# Patient Record
Sex: Male | Born: 1950 | Race: White | Hispanic: No | Marital: Single | Smoking: Current every day smoker
Health system: Southern US, Community
[De-identification: ages and names within clinical notes are randomized; demographics above are authoritative.]

## PROBLEM LIST (undated history)

## (undated) DIAGNOSIS — F1010 Alcohol abuse, uncomplicated: Secondary | ICD-10-CM

## (undated) DIAGNOSIS — I219 Acute myocardial infarction, unspecified: Secondary | ICD-10-CM

## (undated) DIAGNOSIS — I2510 Atherosclerotic heart disease of native coronary artery without angina pectoris: Secondary | ICD-10-CM

## (undated) DIAGNOSIS — F1020 Alcohol dependence, uncomplicated: Secondary | ICD-10-CM

## (undated) DIAGNOSIS — I10 Essential (primary) hypertension: Secondary | ICD-10-CM

## (undated) DIAGNOSIS — J449 Chronic obstructive pulmonary disease, unspecified: Secondary | ICD-10-CM

## (undated) DIAGNOSIS — F319 Bipolar disorder, unspecified: Secondary | ICD-10-CM

## (undated) DIAGNOSIS — I639 Cerebral infarction, unspecified: Secondary | ICD-10-CM

## (undated) HISTORY — PX: CHOLECYSTECTOMY: SHX55

## (undated) HISTORY — PX: CORONARY ARTERY BYPASS GRAFT: SHX141

## (undated) HISTORY — PX: CORONARY STENT PLACEMENT: SHX1402

## (undated) HISTORY — PX: APPENDECTOMY: SHX54

## (undated) SURGERY — LEFT HEART CATH AND CORONARY ANGIOGRAPHY
Anesthesia: Moderate Sedation | Laterality: Left

---

## 2009-07-20 ENCOUNTER — Inpatient Hospital Stay: Payer: Self-pay | Admitting: Unknown Physician Specialty

## 2009-07-21 ENCOUNTER — Ambulatory Visit: Payer: Self-pay | Admitting: Cardiology

## 2010-07-05 ENCOUNTER — Inpatient Hospital Stay: Payer: Self-pay | Admitting: Surgery

## 2011-03-14 ENCOUNTER — Inpatient Hospital Stay: Payer: Self-pay | Admitting: Internal Medicine

## 2011-03-15 ENCOUNTER — Encounter: Payer: Self-pay | Admitting: Cardiovascular Disease

## 2011-03-15 DIAGNOSIS — R079 Chest pain, unspecified: Secondary | ICD-10-CM

## 2011-03-15 DIAGNOSIS — I2510 Atherosclerotic heart disease of native coronary artery without angina pectoris: Secondary | ICD-10-CM

## 2011-07-16 ENCOUNTER — Emergency Department: Payer: Self-pay | Admitting: Unknown Physician Specialty

## 2011-11-10 ENCOUNTER — Emergency Department: Payer: Self-pay | Admitting: Emergency Medicine

## 2011-11-10 LAB — CBC
HCT: 39.5 % — ABNORMAL LOW (ref 40.0–52.0)
HGB: 13.2 g/dL (ref 13.0–18.0)
MCH: 30 pg (ref 26.0–34.0)
MCHC: 33.5 g/dL (ref 32.0–36.0)
MCV: 89 fL (ref 80–100)
Platelet: 212 10*3/uL (ref 150–440)
RDW: 13.2 % (ref 11.5–14.5)
WBC: 10.9 10*3/uL — ABNORMAL HIGH (ref 3.8–10.6)

## 2011-11-10 LAB — COMPREHENSIVE METABOLIC PANEL
Albumin: 3.6 g/dL (ref 3.4–5.0)
Alkaline Phosphatase: 84 U/L (ref 50–136)
Calcium, Total: 8.4 mg/dL — ABNORMAL LOW (ref 8.5–10.1)
Chloride: 108 mmol/L — ABNORMAL HIGH (ref 98–107)
Creatinine: 1.11 mg/dL (ref 0.60–1.30)
EGFR (African American): >60
Osmolality: 277 (ref 275–301)
SGOT(AST): 24 U/L (ref 15–37)
SGPT (ALT): 17 U/L
Sodium: 139 mmol/L (ref 136–145)

## 2011-11-10 LAB — TROPONIN I: Troponin-I: <0.02 ng/mL

## 2012-01-13 ENCOUNTER — Emergency Department: Payer: Self-pay | Admitting: Emergency Medicine

## 2012-01-13 LAB — PROTIME-INR: Prothrombin Time: 12 secs (ref 11.5–14.7)

## 2012-01-13 LAB — BASIC METABOLIC PANEL
Calcium, Total: 9.1 mg/dL (ref 8.5–10.1)
Glucose: 75 mg/dL (ref 65–99)
Osmolality: 274 (ref 275–301)
Potassium: 4.6 mmol/L (ref 3.5–5.1)

## 2012-01-13 LAB — CBC
HCT: 43.6 % (ref 40.0–52.0)
HGB: 14.3 g/dL (ref 13.0–18.0)
MCH: 29.2 pg (ref 26.0–34.0)
MCHC: 32.8 g/dL (ref 32.0–36.0)
MCV: 89 fL (ref 80–100)
RDW: 13.8 % (ref 11.5–14.5)

## 2012-01-13 LAB — CK TOTAL AND CKMB (NOT AT ARMC): CK-MB: 1 ng/mL (ref 0.5–3.6)

## 2012-01-13 LAB — PRO B NATRIURETIC PEPTIDE: B-Type Natriuretic Peptide: 495 pg/mL — ABNORMAL HIGH (ref 0–125)

## 2012-03-05 ENCOUNTER — Emergency Department: Payer: Self-pay | Admitting: Emergency Medicine

## 2012-07-25 ENCOUNTER — Inpatient Hospital Stay: Payer: Self-pay | Admitting: Student

## 2012-07-25 LAB — CK TOTAL AND CKMB (NOT AT ARMC)
CK, Total: 100 U/L (ref 35–232)
CK-MB: 1.7 ng/mL (ref 0.5–3.6)

## 2012-07-25 LAB — COMPREHENSIVE METABOLIC PANEL
Albumin: 3.6 g/dL (ref 3.4–5.0)
Bilirubin,Total: 0.7 mg/dL (ref 0.2–1.0)
Chloride: 105 mmol/L (ref 98–107)
EGFR (African American): >60
Glucose: 116 mg/dL — ABNORMAL HIGH (ref 65–99)
Osmolality: 278 (ref 275–301)
Potassium: 4.1 mmol/L (ref 3.5–5.1)
SGOT(AST): 24 U/L (ref 15–37)
Total Protein: 7.5 g/dL (ref 6.4–8.2)

## 2012-07-25 LAB — TROPONIN I: Troponin-I: <0.02 ng/mL

## 2012-07-25 LAB — CBC
HCT: 42.2 % (ref 40.0–52.0)
MCV: 88 fL (ref 80–100)
WBC: 10.3 10*3/uL (ref 3.8–10.6)

## 2012-07-25 LAB — PRO B NATRIURETIC PEPTIDE: B-Type Natriuretic Peptide: 1416 pg/mL — ABNORMAL HIGH (ref 0–125)

## 2012-07-26 DIAGNOSIS — I369 Nonrheumatic tricuspid valve disorder, unspecified: Secondary | ICD-10-CM

## 2012-07-26 LAB — TROPONIN I: Troponin-I: <0.02 ng/mL

## 2012-07-26 LAB — BASIC METABOLIC PANEL
Anion Gap: 8 (ref 7–16)
BUN: 25 mg/dL — ABNORMAL HIGH (ref 7–18)
Chloride: 101 mmol/L (ref 98–107)
Creatinine: 1.16 mg/dL (ref 0.60–1.30)
EGFR (Non-African Amer.): >60
Glucose: 137 mg/dL — ABNORMAL HIGH (ref 65–99)
Potassium: 4.1 mmol/L (ref 3.5–5.1)
Sodium: 133 mmol/L — ABNORMAL LOW (ref 136–145)

## 2012-07-26 LAB — LIPID PANEL
HDL Cholesterol: 48 mg/dL (ref 40–60)
Ldl Cholesterol, Calc: 130 mg/dL — ABNORMAL HIGH (ref 0–100)

## 2012-07-27 LAB — BASIC METABOLIC PANEL
Anion Gap: 6 — ABNORMAL LOW (ref 7–16)
BUN: 36 mg/dL — ABNORMAL HIGH (ref 7–18)
Chloride: 103 mmol/L (ref 98–107)
Co2: 26 mmol/L (ref 21–32)
Creatinine: 1.45 mg/dL — ABNORMAL HIGH (ref 0.60–1.30)
EGFR (African American): 60 — ABNORMAL LOW
EGFR (Non-African Amer.): 52 — ABNORMAL LOW
Potassium: 4.4 mmol/L (ref 3.5–5.1)
Sodium: 135 mmol/L — ABNORMAL LOW (ref 136–145)

## 2012-12-29 ENCOUNTER — Emergency Department: Payer: Self-pay | Admitting: Emergency Medicine

## 2012-12-29 LAB — URINALYSIS, COMPLETE
Bacteria: NONE SEEN
Bilirubin,UR: NEGATIVE
Blood: NEGATIVE
Glucose,UR: NEGATIVE mg/dL (ref 0–75)
Ketone: NEGATIVE
Nitrite: NEGATIVE
Ph: 5 (ref 4.5–8.0)
Protein: NEGATIVE
RBC,UR: <1 /HPF (ref 0–5)
Specific Gravity: 1.02 (ref 1.003–1.030)

## 2012-12-29 LAB — COMPREHENSIVE METABOLIC PANEL
Albumin: 3.6 g/dL (ref 3.4–5.0)
Alkaline Phosphatase: 75 U/L (ref 50–136)
BUN: 14 mg/dL (ref 7–18)
Bilirubin,Total: 0.6 mg/dL (ref 0.2–1.0)
Calcium, Total: 8.8 mg/dL (ref 8.5–10.1)
Chloride: 107 mmol/L (ref 98–107)
Co2: 26 mmol/L (ref 21–32)
Creatinine: 1.47 mg/dL — ABNORMAL HIGH (ref 0.60–1.30)
Osmolality: 278 (ref 275–301)
Potassium: 4 mmol/L (ref 3.5–5.1)
SGOT(AST): 11 U/L — ABNORMAL LOW (ref 15–37)
Sodium: 139 mmol/L (ref 136–145)

## 2012-12-29 LAB — CBC
HCT: 38.1 % — ABNORMAL LOW (ref 40.0–52.0)
MCH: 30.3 pg (ref 26.0–34.0)
MCHC: 34.8 g/dL (ref 32.0–36.0)
MCV: 87 fL (ref 80–100)
RBC: 4.38 10*6/uL — ABNORMAL LOW (ref 4.40–5.90)
WBC: 7.1 10*3/uL (ref 3.8–10.6)

## 2012-12-29 LAB — TROPONIN I: Troponin-I: <0.02 ng/mL

## 2012-12-29 LAB — PRO B NATRIURETIC PEPTIDE: B-Type Natriuretic Peptide: 957 pg/mL — ABNORMAL HIGH (ref 0–125)

## 2012-12-29 LAB — CK TOTAL AND CKMB (NOT AT ARMC): CK, Total: 66 U/L (ref 35–232)

## 2013-09-03 ENCOUNTER — Ambulatory Visit: Payer: Self-pay | Admitting: Pain Medicine

## 2014-02-21 ENCOUNTER — Emergency Department: Payer: Self-pay | Admitting: Emergency Medicine

## 2014-02-21 LAB — BASIC METABOLIC PANEL
Anion Gap: 6 — ABNORMAL LOW (ref 7–16)
BUN: 15 mg/dL (ref 7–18)
CALCIUM: 8.7 mg/dL (ref 8.5–10.1)
CREATININE: 1.26 mg/dL (ref 0.60–1.30)
Chloride: 111 mmol/L — ABNORMAL HIGH (ref 98–107)
Co2: 19 mmol/L — ABNORMAL LOW (ref 21–32)
EGFR (African American): >60
GLUCOSE: 77 mg/dL (ref 65–99)
OSMOLALITY: 272 (ref 275–301)
POTASSIUM: 4.4 mmol/L (ref 3.5–5.1)
Sodium: 136 mmol/L (ref 136–145)

## 2014-02-21 LAB — CBC
HCT: 45.1 % (ref 40.0–52.0)
HGB: 15.2 g/dL (ref 13.0–18.0)
MCH: 31.4 pg (ref 26.0–34.0)
MCHC: 33.7 g/dL (ref 32.0–36.0)
MCV: 93 fL (ref 80–100)
Platelet: 214 10*3/uL (ref 150–440)
RBC: 4.86 10*6/uL (ref 4.40–5.90)
RDW: 14.7 % — ABNORMAL HIGH (ref 11.5–14.5)
WBC: 10.1 10*3/uL (ref 3.8–10.6)

## 2014-02-21 LAB — TROPONIN I

## 2014-02-21 LAB — PRO B NATRIURETIC PEPTIDE: B-Type Natriuretic Peptide: 676 pg/mL — ABNORMAL HIGH (ref 0–125)

## 2014-08-06 IMAGING — CR DG CHEST 1V PORT
1 series · 1 of 1 positions shown · non-contrast
Comparison: none

REASON FOR EXAM: Chest pain
COMMENTS:

PROCEDURE:     DXR - DXR PORTABLE CHEST SINGLE VIEW  - December 29, 2012 [DATE]
RESULT:     Comparison: 07/25/2012

[ap]
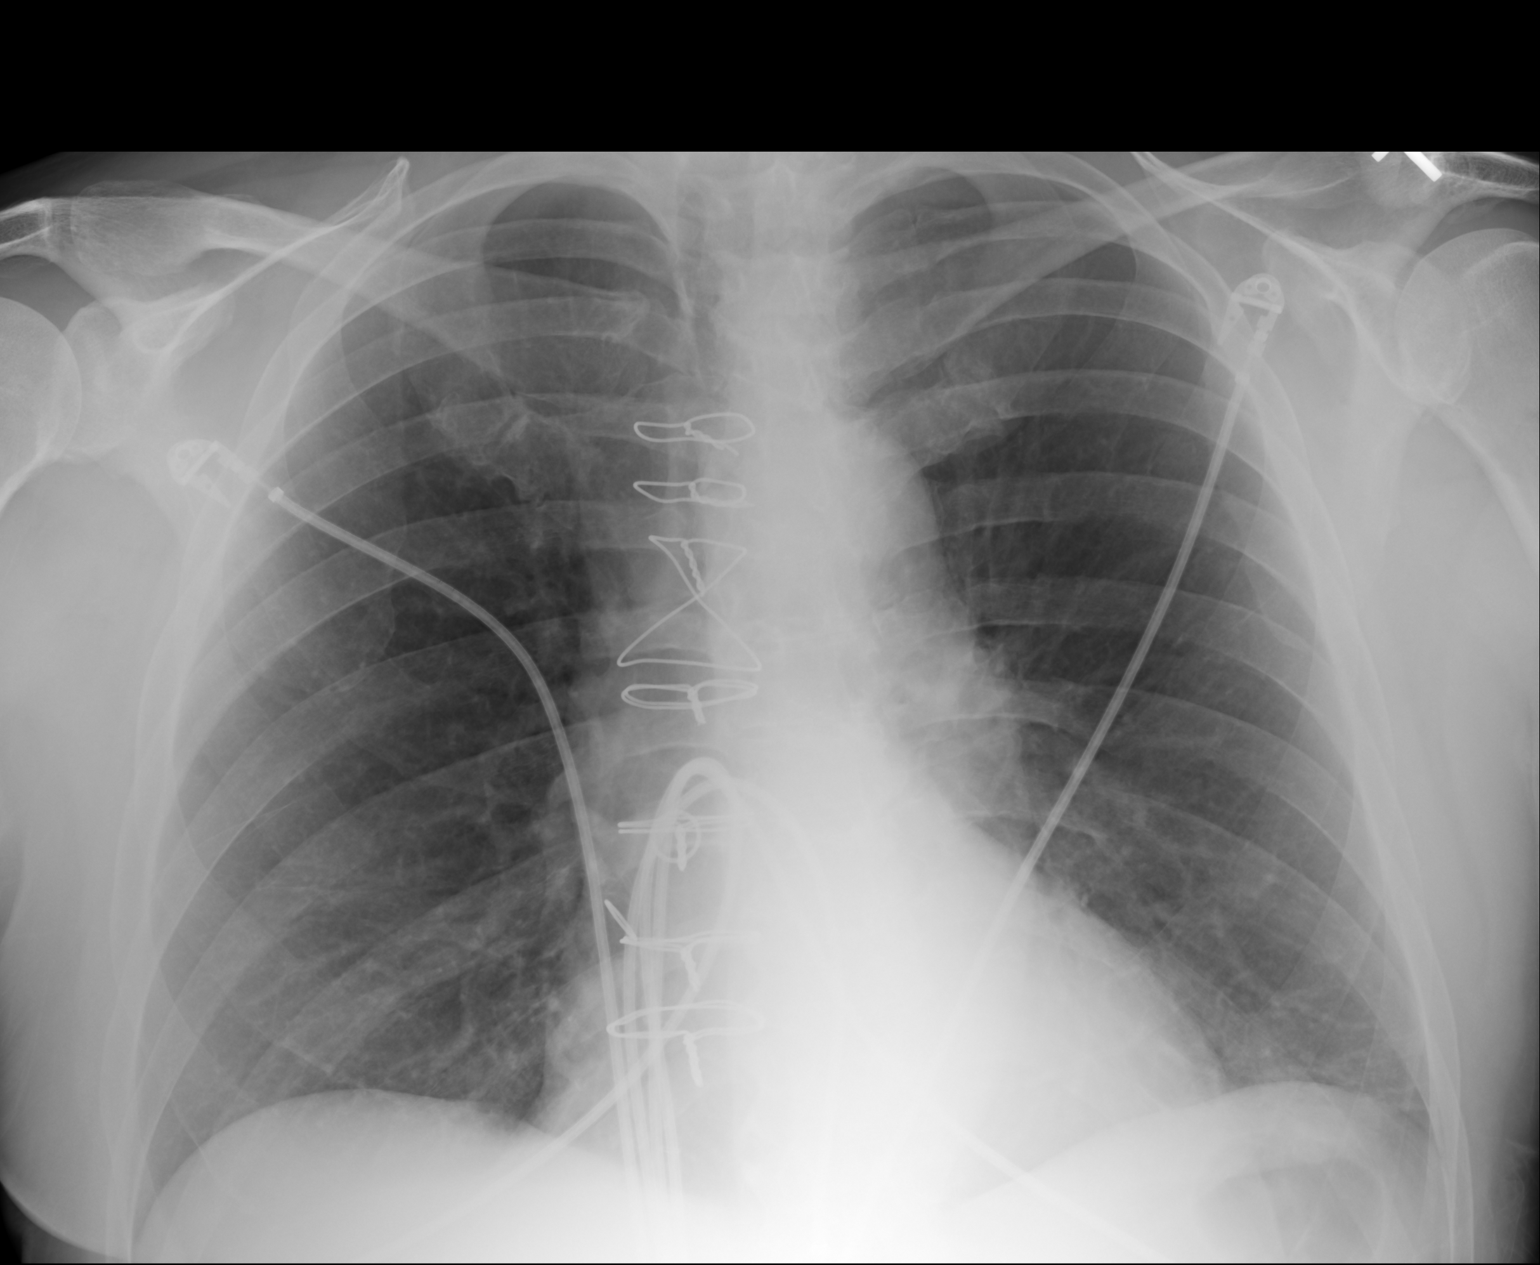

[1 of 1 positions shown; findings below may reference images not displayed]

FINDINGS: The heart and mediastinum are stable. Prior median sternotomy. Mild left
basilar opacities are likely secondary to atelectasis.
IMPRESSION: Mild left basilar opacities are likely secondary to atelectasis. However,
followup PA and lateral chest radiograph is recommended.

[REDACTED]

## 2014-08-19 ENCOUNTER — Observation Stay: Payer: Self-pay | Admitting: Internal Medicine

## 2014-08-19 DIAGNOSIS — R079 Chest pain, unspecified: Secondary | ICD-10-CM

## 2014-08-22 ENCOUNTER — Encounter: Payer: Self-pay | Admitting: Cardiovascular Disease

## 2014-10-17 NOTE — H&P (Signed)
PATIENT NAME:  [name], [name] DATE OF BIRTH:  October 01, 1950  DATE OF ADMISSION:  07/25/2012  PRIMARY CARE PHYSICIAN: No PCP.  REFERRING PHYSICIAN: Daryel November, MD  CHIEF COMPLAINT: Cough, shortness of breath and wheezing for 2 to 3 days.   HISTORY OF PRESENT ILLNESS: The patient is a 64 year old Caucasian male with history of CAD, COPD, bipolar disorder, depression and anxiety who presented to the ED with shortness of breath, cough and wheezing for 2 to 3 days. The patient denies any fever but has chills. He has no PCP and he only sees a [facility] cardiologist for his CAD after his surgery in 2012. The patient has only mild sputum, which is clear. No hemoptysis, no orthopnea, no nocturnal dyspnea and no leg edema.   PAST MEDICAL HISTORY:  As mentioned above, CAD, COPD, bipolar disorder, depression and anxiety.   PAST SURGICAL HISTORY: Appendectomy and cholecystectomy.   FAMILY HISTORY: Father died of heart attack at age 62. Mother died of metastatic colon cancer.   SOCIAL HISTORY: The patient has smoked 1/2 pack a day for 32 years. She denies any alcohol drinking or illicit drugs.  ALLERGIES: No known drug allergies.   MEDICATIONS:  1. Zithromax (Z-Pak).  2. Robitussin AC 5 mL every 6 hours p.r.n. for cough.  3. Lopressor 25 mg 3 tablets 2 times a day.  4.  Lithium300 mg p.o. 3 times daily. 5. Lisinopril 2.5 mg p.o. daily.  6. Citalopram 40 mg p.o. daily.  7. Buspirone 10 mg p.o. 2 times a day.  REVIEW OF SYSTEMS: CONSTITUTIONAL: The patient denies any fever or chills. No headache or dizziness. No weakness. EYES: No double vision, blurred vision.  ENT: No postnasal drip, slurred speech or dysphagia.  SKIN: No rash or jaundice.  CARDIOVASCULAR: No chest pain, palpitations, orthopnea or nocturnal dyspnea. No leg edema.  PULMONARY: Positive for cough, sputum and shortness of breath, but no hemoptysis. The patient also has wheezing.  GASTROINTESTINAL: No abdominal pain,  nausea, vomiting or diarrhea. No melena or bloody stool.  GENITOURINARY: No dysuria, hematuria or incontinence.  HEMATOLOGY: No easy bruising or bleeding.  NEUROLOGY: No syncope, loss of consciousness or seizure.   PHYSICAL EXAMINATION: VITALS: Temperature 99, blood pressure 158/80, pulse 80, respirations 20, O2 saturation 95% on oxygen 2 liters by nasal cannula.  GENERAL: The patient is alert, awake and oriented, in no acute distress.  HEENT: Pupils round, equal and reactive to light and accommodation.  NECK: Supple. No JVD or carotid bruit. No lymphadenopathy. No thyromegaly. Moist oral mucosa. Clear oropharynx.  NECK: Supple. No JVD or carotid bruit. No lymphadenopathy.  CARDIOVASCULAR: S1, S2 regular rate and rhythm. No murmurs or gallops.  PULMONARY: Bilateral air entry. Weak breath sounds. No obvious wheezing, crackles or rales. No use of accessory muscles to breathe. ABDOMEN: Soft. No distention or tenderness. No organomegaly. Bowel sounds present.  EXTREMITIES: No edema, clubbing or cyanosis. No calf tenderness. Strong bilateral pedal pulses.  SKIN: No rash or jaundice.  NEUROLOGY: No syncope, loss of consciousness or seizure.  LABS/RADIOLOGIC STUDIES: Chest x-ray consistent with COPD maybe superimposed with low grade CHF. No pneumonia.  BNP 1416. CK 100 and CK-MB 1.7. CBC normal. Glucose 116, BUN 22 and creatinine 1.13. Troponin less than 0.02.   EKG showed normal sinus rhythm at 87 BPM with T-wave abnormality inversion.   IMPRESSION: 1. Chronic obstructive pulmonary disease exacerbation.  2. Possible acute congestive heart failure. 3. Hypertension.  4. Coronary artery disease.  5. Tobacco abuse.  PLAN OF TREATMENT:  1. The patient will be admitted to the telemetry floor. We will continue O2 by nasal cannula, give Solu-Medrol and DuoNebs. We will give also Spiriva and start Zithromax.  2. We will get an echocardiogram to evaluate the patient's cardiac function. Continue  lisinopril, Lopressor, aspirin and statin. We will check TSH and lipid panel.  3. Smoking cessation. Was counseled for 7 minutes.   I discussed the patient's situation and plan of treatment with the patient and the patient's girlfriend.   TIME SPENT: About 58 minutes.  ____________________________ Shaune PollackQing Jakeem Grape, MD qc:sb D: 07/25/2012 13:31:08 ET T: 07/25/2012 13:52:26 ET JOB#: [id number]  cc: Shaune PollackQing Soundra Lampley, MD, <Dictator> Shaune PollackQING [place] MD ELECTRONICALLY SIGNED 07/25/2012 18:21

## 2014-10-17 NOTE — Discharge Summary (Signed)
PATIENT NAME:  [name], [name] MR#:  [id number] DATE OF BIRTH:  Jun 23, 1951  DATE OF ADMISSION:  07/25/2012  DATE OF DISCHARGE:  07/27/2012  CHIEF COMPLAINT: Shortness of breath, wheezing.   DISCHARGE DIAGNOSES:  1. Shortness of breath, likely acute chronic obstructive pulmonary disease exacerbation and possible mild diastolic congestive heart failure.  2. Mild renal failure. 3. Hypertension.  4. Coronary artery disease.  5. Tobacco abuse.  6. Anxiety.  7. Depression.  8. A history of bipolar.   DISCHARGE MEDICATIONS: Lithium 300 mg 3 times a day, Buspirone 10 mg 2 times a day, Citalopram 40 mg daily, lisinopril 2.5 mg once a day, metoprolol tartrate 25 mg 3 tabs 2 times a day, tiotropium 18 mcg inhaled one cap once a day, fluticasone/salmeterol 250/50 mcg inhaled powder 1 puff 2 times a day, prednisone 50 mg daily and taper x 10 mg until done in 5 days, aspirin 81 mg daily, atorvastatin 20 mg daily.   DIET: Low sodium, low fat, low cholesterol.   ACTIVITY: As tolerated.   FOLLOWUP: Please follow with [facility] on 08/20/2012 at 11:00 a.m. and your appointment is actually at 11:40. Please follow up with your outpatient cardiologist on 09/13/2012 as previously scheduled, and please check a BMP for creatinine follow-up with your Cardiology appointment   HISTORY OF PRESENT ILLNESS AND BRIEF HOSPITAL COURSE: For full details of history and physical, please see the dictation by Dr. Imogene Burn on January 29th, but briefly this is a 64 year old male with history of COPD, CAD, tobacco abuse, who came in with wheezing for 2 or 3 days, shortness of breath and a cough. He was noted to have a COPD exacerbation, admitted to the hospitalist service with IV steroids, DuoNebs. He was started on Spiriva and as he did have a mild elevation of BNP, the echocardiogram was obtained. The patient did have a BNP of 1416 on arrival with BUN of 22, creatinine of 1.13. LFTs are within normal limits and troponins were  negative x 3. He did not have a significant leukocytosis. He did have an x-ray of the chest done which was PA and lateral, showing findings consistent with COPD and a possible low-grade superimposed CHF without evidence for pneumonia or bronchitis. An echocardiogram was done on January 29th showing an EF greater than 55% with a possible LV relaxation impairment suggesting of diastolic dysfunction. Therefore with the elevation of BNP and mild low-grade CHF, he was started on Lasix in addition to COPD regimens. He did well through the hospitalization, his shortness of breath is better and he has been ambulating in the hallways, but given the Lasix, he did have a mild renal failure with creatinine uptrending to 1.45 today. On arrival it was 1.13. The patient at this point is eager to go home. He feels much better symptomatically. He will be discharged with prednisone taper, inhalers as above. The Lasix has been held and I offered to recheck in [facility] tomorrow but he is eager to go home. I strongly recommended to him to keep the appointment with his cardiologist on the third of next month for BNP check. At this point, he will be discharged with outpatient follow-up. He also has received an appointment with [facility] for initiation of care as he has no PCP. The appointment is on 08/10/2012.   CODE STATUS: FULL CODE.   TOTAL TIME SPENT: 35 minutes.   ____________________________ Krystal Eaton, MD sa:jm D: 07/27/2012 10:42:46 ET T: 07/27/2012 13:13:22 ET JOB#: [id number]  cc:  Krystal EatonShayiq Analyn Matusek, MD, <Dictator> Krystal EatonSHAYIQ Orlen Leedy MD ELECTRONICALLY SIGNED 08/02/2012 20:06

## 2014-10-26 NOTE — H&P (Signed)
PATIENT NAME:  [name], [name] MR#:  [id number] DATE OF BIRTH:  [birth year]  DATE OF ADMISSION:  08/19/2014  PRIMARY CARE PHYSICIAN: Nonlocal.    REFERRING PHYSICIAN: Rebecka Apley, MD   CHIEF COMPLAINT: Chest pain for 5 days, also chest pain last night.   HISTORY OF PRESENT ILLNESS: A 64 years old Caucasian male with a history of hypertension, CAD and COPD presented to the ED with chest pain for 4 or 5 days. The patient is alert, awake, oriented, in no acute distress.  The patient said he has had chest pain on and off for the past 5 days. Chest pain is on the left side with radiation to the left arm. In addition, the patient feels nauseous and has some sweating. The patient drank a lot of alcohol last night, started to have chest pain on the left side again which is sharp, radiation to the left arm. The patient also had sweating and palpitation but the patient denies any headache or dizziness, denies any other symptoms.   PAST MEDICAL HISTORY: Hypertension, CAD, COPD, alcohol abuse, bipolar disorder, depression, and anxiety.   SOCIAL HISTORY: Smokes 1 pack a day for 30 years. Drinks alcohol heavily recently. Denies any drug abuse.   FAMILY HISTORY: Father died of a heart attack at age 87. Mother died of metastatic colon cancer.   SURGICAL HISTORY: Appendectomy and cholecystectomy.   ALLERGY: None.   HOME MEDICATIONS: Medication reconciliation list is not done yet. We will update later.    REVIEW OF SYSTEMS:  CONSTITUTIONAL: The patient denies any fever or chills. No headache or dizziness or weakness.  EYES: No double vision or blurred vision.  EARS, NOSE, AND THROAT: No postnasal drip, slurred speech, or dysphagia.  CARDIOVASCULAR: Positive for chest pain, palpitation. No orthopnea or nocturnal dyspnea. No leg edema.  PULMONARY: No cough, sputum, shortness of breath, or hematemesis.  GASTROINTESTINAL: No abdominal pain, nausea, vomiting, or diarrhea. No melena or bloody stool.   GENITOURINARY: No dysuria, hematuria, or incontinence.  SKIN: No rash, jaundice.  NEUROLOGY: No syncope, loss of consciousness, or seizure.  ENDOCRINOLOGY: No polyuria, polydipsia, heat or cold intolerance.  HEMATOLOGY: No easy bruising or bleeding.  PSYCHIATRY: Has anxiety but no depression, has stress from family recently.   PHYSICAL EXAMINATION:  VITAL SIGNS: Temperature 97.3, blood pressure 145/65, pulse 83, O2 saturation 95% on room air.  GENERAL: The patient is alert, awake, oriented, in no acute distress.  HEENT: Pupils round, equal, react to light and accommodation. Moist oral mucosa. Clear pharynx. NECK: Supple. No JVD or carotid bruit. No lymphadenopathy. No thyromegaly.  CARDIOVASCULAR: S1, S2, regular rate and rhythm. No murmurs or gallop.  PULMONARY: Bilateral air entry. No wheezing or rales. No use of accessory muscles to breathe.  ABDOMEN: Soft. No distention or tenderness. No organomegaly. Bowel sounds present. Obese.   EXTREMITIES: No edema, clubbing, or cyanosis. No calf tenderness. Bilateral pedal pulses present.  SKIN: No rash or jaundice.  NEUROLOGIC: A and O x 3. No focal deficit except. Power 5/5. Sensation intact and no tremor or shaking.    LABORATORY DATA: CK 149, CK-MB 1.4, troponin less than 0.02, glucose 92, BUN 9, creatinine 1.16. Electrolytes normal except potassium 3.2. CBC in normal range. Alcohol level was 324. Chest x-ray: No acute pulmonary disease. Urinalysis is negative. Urine toxicology showed MDMA positive. EKG showed normal sinus rhythm at 75 BPM with T wave inversion on leads II, V5, V6.   IMPRESSIONS:  1.  Chest pain, possible angina need to rule  out acute coronary syndrome.  2.  Hypertension.  3.  Chronic obstructive pulmonary disease.  4.  History of coronary artery disease.   5.  Alcohol abuse.  6.  Tobacco abuse.   7.  Hypokalemia.   PLAN OF TREATMENT:  1.  The patient will be placed for observation and we will repeat a troponin x 2.   The patient was given aspirin 325 mg in the ED.  We will continue aspirin, give Zocor, follow up troponin level, and get a stress test if troponin level is negative.  2.  For hypertension, we will continue patient's home hypertension medication.   3.  For COPD, we will give nebulizer p.r.n. and patient's home nebulizer medication.  4.  For hypokalemia, we will give potassium and check a magnesium level and a potassium level.  5.  For tobacco abuse, smoking cessation was counseled for 3 to 4 minutes. We will give nicotine patch.  6.  For alcohol abuse, we will start CIWA protocol.  7.  I discussed the patient's condition and plan of treatment with the patient. The patient wants full code.   TIME SPENT: About 46 minutes.   ____________________________ Shaune PollackQing Zeplin Aleshire, MD qc:AT D: 08/19/2014 02:50:25 ET T: 08/19/2014 03:39:08 ET JOB#: [id number]  cc: Shaune PollackQing Lanisa Ishler, MD, <Dictator> Shaune PollackQING Robi Dewolfe MD ELECTRONICALLY SIGNED 08/20/2014 10:53

## 2014-10-26 NOTE — Discharge Summary (Signed)
PATIENT NAME:  [name], Travonta E MR#:  [id number] DATE OF BIRTH:  February 28, 1951  DATE OF ADMISSION:  08/19/2014 DATE OF DISCHARGE:  08/19/2014  ADMITTING PHYSICIAN: Shaune PollackQing Chen, MD  DISCHARGING PHYSICIAN: Enid Baasadhika Manasvi Dickard, MD  PRIMARY CARE PHYSICIAN: Nonlocal.  PRIMARY CARDIOLOGIST: [facility].  CONSULTATIONS WHILE IN THE HOSPITAL: None.  DISCHARGE DIAGNOSES:  1.  Chest pain, likely musculoskeletal versus chronic angina. 2.  Coronary artery disease status post bypass graft surgery. 3.  Hypertension. 4.  Alcohol abuse. 5.  Chronic obstructive pulmonary disease. 6.  Bipolar disorder.  DISCHARGE HOME MEDICATIONS:  1.  Lithium 300 mg p.o. t.i.d. 2.  BuSpar 10 mg p.o. b.i.d.  3.  Celexa 40 mg p.o. daily. 4.  Lisinopril 2.5 mg p.o. daily. 5.  Aspirin 81 mg p.o. daily. 6.  Viagra 50 mg p.o. daily as needed for erectile dysfunction. 7.  Advair 250/50 one puff b.i.d.  8.  Klonopin 0.5 mg p.o. b.i.d.  9.  Lovastatin 40 mg p.o. daily. 10.  Metoprolol 100 mg p.o. b.i.d.  11.  Spiriva HandiHaler 1 capsule inhalation daily. 12.  Norco 5/325 mg 1 tablet q. 8 hours p.r.n. for moderate pain.  DISCHARGE DIET: Low sodium diet. DISCHARGE ACTIVITY: As tolerated.  FOLLOWUP INSTRUCTIONS:  1.  Cardiology followup in 1-2 weeks. 2.  PCP followup in 1 week.  LABORATORY AND IMAGING STUDIES PRIOR TO DISCHARGE:  1.  Lexiscan Myoview showing no significant ischemia, no wall motion abnormality, EF is 63%. Left ventricular global function was normal. Overall low risk study.  2.  Troponins were negative in the hospital.  3.  Chest x-ray showing no acute cardiopulmonary process.  4.  Urinalysis was negative for any infection.  5.  Urine toxicology screen positive for MDMA. 6.  WBC 6.4, hemoglobin 13.9, hematocrit 42.2, platelet count 119,000. 7.  Sodium 143, potassium 3.2, chloride 109, bicarbonate 27, BUN 9, creatinine 1.16, glucose 92, calcium of 8.0. 8.  ALT 16, AST 28, alkaline phosphatase 84, total bilirubin  0.3, albumin of 3.1.  9.  Ethanol level was elevated at 324 when he came in.  BRIEF HOSPITAL COURSE: [name] is a 64 year old Caucasian male with past medical history significant for alcohol abuse, CAD status post bypass surgery, COPD, bipolar disorder admitted to the hospital secondary to chest pain.  1.  Chest pain: Initially thought to be angina, was more musculoskeletal in nature with tenderness to touch. Had a Myoview because of his previous coronary artery disease history and Myoview came negative. The patient was pain-free. He was actually intoxicated when he came in. Troponins were negative. He was ruled out and is being discharged in a stable condition. Was advised to follow up with his cardiologist. All his cardiac medications are being continued. He is already on aspirin, lisinopril, metoprolol, and statin. 2.  Hypertension: Home medications were continued.  3.  COPD: Stable on inhalers. 4.  Alcohol abused: Counseled against drinking. 5.  Bipolar disorder: Home medications were continued.  His course has been otherwise uneventful in the hospital.   DISCHARGE CONDITION: Stable.  DISCHARGE DISPOSITION: Home.  TIME SPENT ON DISCHARGE: 40 minutes.   ____________________________ Enid Baasadhika Caisley Baxendale, MD rk:bm D: 08/21/2014 15:39:00 ET T: 08/22/2014 02:25:47 ET JOB#: [id number]  cc: Enid Baasadhika Andrew Soria, MD, <Dictator> Enid BaasADHIKA Kaizer Dissinger MD ELECTRONICALLY SIGNED 09/11/2014 17:43

## 2014-12-17 ENCOUNTER — Other Ambulatory Visit: Payer: Self-pay

## 2014-12-17 ENCOUNTER — Observation Stay
Admission: EM | Admit: 2014-12-17 | Discharge: 2014-12-18 | Disposition: A | Discharge status: Home / Self Care | Payer: Medicare PPO | Attending: Internal Medicine | Admitting: Internal Medicine

## 2014-12-17 ENCOUNTER — Emergency Department: Payer: Medicare PPO

## 2014-12-17 ENCOUNTER — Encounter: Payer: Self-pay | Admitting: Emergency Medicine

## 2014-12-17 DIAGNOSIS — I25110 Atherosclerotic heart disease of native coronary artery with unstable angina pectoris: Secondary | ICD-10-CM | POA: Diagnosis not present

## 2014-12-17 DIAGNOSIS — F1010 Alcohol abuse, uncomplicated: Secondary | ICD-10-CM | POA: Diagnosis present

## 2014-12-17 DIAGNOSIS — J449 Chronic obstructive pulmonary disease, unspecified: Secondary | ICD-10-CM | POA: Diagnosis present

## 2014-12-17 DIAGNOSIS — Z951 Presence of aortocoronary bypass graft: Secondary | ICD-10-CM | POA: Insufficient documentation

## 2014-12-17 DIAGNOSIS — I252 Old myocardial infarction: Secondary | ICD-10-CM | POA: Insufficient documentation

## 2014-12-17 DIAGNOSIS — Z955 Presence of coronary angioplasty implant and graft: Secondary | ICD-10-CM | POA: Diagnosis not present

## 2014-12-17 DIAGNOSIS — I209 Angina pectoris, unspecified: Secondary | ICD-10-CM

## 2014-12-17 DIAGNOSIS — Z7982 Long term (current) use of aspirin: Secondary | ICD-10-CM | POA: Diagnosis not present

## 2014-12-17 DIAGNOSIS — Z8379 Family history of other diseases of the digestive system: Secondary | ICD-10-CM | POA: Diagnosis not present

## 2014-12-17 DIAGNOSIS — F17210 Nicotine dependence, cigarettes, uncomplicated: Secondary | ICD-10-CM | POA: Insufficient documentation

## 2014-12-17 DIAGNOSIS — R079 Chest pain, unspecified: Secondary | ICD-10-CM

## 2014-12-17 DIAGNOSIS — I200 Unstable angina: Secondary | ICD-10-CM | POA: Diagnosis present

## 2014-12-17 DIAGNOSIS — Z8249 Family history of ischemic heart disease and other diseases of the circulatory system: Secondary | ICD-10-CM | POA: Diagnosis not present

## 2014-12-17 DIAGNOSIS — Z79899 Other long term (current) drug therapy: Secondary | ICD-10-CM | POA: Insufficient documentation

## 2014-12-17 DIAGNOSIS — I2510 Atherosclerotic heart disease of native coronary artery without angina pectoris: Secondary | ICD-10-CM | POA: Diagnosis present

## 2014-12-17 DIAGNOSIS — Z7902 Long term (current) use of antithrombotics/antiplatelets: Secondary | ICD-10-CM | POA: Diagnosis not present

## 2014-12-17 DIAGNOSIS — I10 Essential (primary) hypertension: Secondary | ICD-10-CM | POA: Diagnosis not present

## 2014-12-17 DIAGNOSIS — Z8673 Personal history of transient ischemic attack (TIA), and cerebral infarction without residual deficits: Secondary | ICD-10-CM | POA: Diagnosis not present

## 2014-12-17 DIAGNOSIS — F319 Bipolar disorder, unspecified: Secondary | ICD-10-CM | POA: Diagnosis not present

## 2014-12-17 HISTORY — DX: Acute myocardial infarction, unspecified: I21.9

## 2014-12-17 HISTORY — DX: Bipolar disorder, unspecified: F31.9

## 2014-12-17 HISTORY — DX: Chronic obstructive pulmonary disease, unspecified: J44.9

## 2014-12-17 HISTORY — DX: Atherosclerotic heart disease of native coronary artery without angina pectoris: I25.10

## 2014-12-17 HISTORY — DX: Essential (primary) hypertension: I10

## 2014-12-17 HISTORY — DX: Cerebral infarction, unspecified: I63.9

## 2014-12-17 HISTORY — DX: Alcohol abuse, uncomplicated: F10.10

## 2014-12-17 LAB — COMPREHENSIVE METABOLIC PANEL
ALK PHOS: 90 U/L (ref 38–126)
ALT: 9 U/L — AB (ref 17–63)
AST: 19 U/L (ref 15–41)
Albumin: 3.2 g/dL — ABNORMAL LOW (ref 3.5–5.0)
Anion gap: 10 (ref 5–15)
BUN: 8 mg/dL (ref 6–20)
CHLORIDE: 103 mmol/L (ref 101–111)
CO2: 28 mmol/L (ref 22–32)
Calcium: 8 mg/dL — ABNORMAL LOW (ref 8.9–10.3)
Creatinine, Ser: 1.01 mg/dL (ref 0.61–1.24)
GLUCOSE: 85 mg/dL (ref 65–99)
POTASSIUM: 3.1 mmol/L — AB (ref 3.5–5.1)
SODIUM: 141 mmol/L (ref 135–145)
Total Bilirubin: 0.3 mg/dL (ref 0.3–1.2)
Total Protein: 6 g/dL — ABNORMAL LOW (ref 6.5–8.1)

## 2014-12-17 LAB — CBC WITH DIFFERENTIAL/PLATELET
BASOS ABS: 0 10*3/uL (ref 0–0.1)
Basophils Relative: 0 %
EOS ABS: 0.1 10*3/uL (ref 0–0.7)
EOS PCT: 1 %
HCT: 40.2 % (ref 40.0–52.0)
HEMOGLOBIN: 14 g/dL (ref 13.0–18.0)
Lymphocytes Relative: 31 %
Lymphs Abs: 2.8 10*3/uL (ref 1.0–3.6)
MCH: 33.1 pg (ref 26.0–34.0)
MCHC: 34.8 g/dL (ref 32.0–36.0)
MCV: 95.3 fL (ref 80.0–100.0)
MONO ABS: 0.4 10*3/uL (ref 0.2–1.0)
Monocytes Relative: 5 %
NEUTROS ABS: 5.7 10*3/uL (ref 1.4–6.5)
NEUTROS PCT: 63 %
PLATELETS: 306 10*3/uL (ref 150–440)
RBC: 4.22 MIL/uL — ABNORMAL LOW (ref 4.40–5.90)
RDW: 14.7 % — ABNORMAL HIGH (ref 11.5–14.5)
WBC: 9.1 10*3/uL (ref 3.8–10.6)

## 2014-12-17 LAB — ETHANOL: ALCOHOL ETHYL (B): 245 mg/dL — AB (ref <5)

## 2014-12-17 LAB — TROPONIN I: Troponin I: <0.03 ng/mL (ref <0.031)

## 2014-12-17 MED ORDER — TIOTROPIUM BROMIDE MONOHYDRATE 18 MCG IN CAPS
18.0000 ug | ORAL_CAPSULE | Freq: Every day | RESPIRATORY_TRACT | Status: DC
  Administered 2014-12-18: 18 ug via RESPIRATORY_TRACT
  Filled 2014-12-17: qty 5

## 2014-12-17 MED ORDER — ACETAMINOPHEN 650 MG RE SUPP: 650.0000 mg | Freq: Four times a day (QID) | RECTAL | Status: DC | PRN

## 2014-12-17 MED ORDER — LORAZEPAM 2 MG/ML IJ SOLN
1.0000 mg | Freq: Four times a day (QID) | INTRAMUSCULAR | Status: DC | PRN
  Administered 2014-12-18 (×2): 1 mg via INTRAVENOUS
  Filled 2014-12-17 (×2): qty 1

## 2014-12-17 MED ORDER — ALBUTEROL SULFATE HFA 108 (90 BASE) MCG/ACT IN AERS: 2.0000 | INHALATION_SPRAY | Freq: Four times a day (QID) | RESPIRATORY_TRACT | Status: DC | PRN

## 2014-12-17 MED ORDER — ASPIRIN 81 MG PO CHEW
CHEWABLE_TABLET | ORAL | Status: AC
  Filled 2014-12-17: qty 4

## 2014-12-17 MED ORDER — ATORVASTATIN CALCIUM 20 MG PO TABS
80.0000 mg | ORAL_TABLET | Freq: Every day | ORAL | Status: DC
  Administered 2014-12-17: 80 mg via ORAL
  Filled 2014-12-17: qty 4

## 2014-12-17 MED ORDER — SODIUM CHLORIDE 0.9 % IV BOLUS (SEPSIS)
1000.0000 mL | Freq: Once | INTRAVENOUS | Status: DC
Start: 2014-12-17 — End: 2014-12-17

## 2014-12-17 MED ORDER — SODIUM CHLORIDE 0.9 % IV BOLUS (SEPSIS)
500.0000 mL | Freq: Once | INTRAVENOUS | Status: AC
  Administered 2014-12-17: 500 mL via INTRAVENOUS

## 2014-12-17 MED ORDER — ENOXAPARIN SODIUM 40 MG/0.4ML ~~LOC~~ SOLN
40.0000 mg | SUBCUTANEOUS | Status: DC
  Administered 2014-12-17: 40 mg via SUBCUTANEOUS
  Filled 2014-12-17: qty 0.4

## 2014-12-17 MED ORDER — ALBUTEROL SULFATE (2.5 MG/3ML) 0.083% IN NEBU: 2.5000 mg | INHALATION_SOLUTION | Freq: Four times a day (QID) | RESPIRATORY_TRACT | Status: DC | PRN

## 2014-12-17 MED ORDER — ADULT MULTIVITAMIN W/MINERALS CH
1.0000 | ORAL_TABLET | Freq: Every day | ORAL | Status: DC
  Administered 2014-12-18: 1 via ORAL
  Filled 2014-12-17: qty 1

## 2014-12-17 MED ORDER — ASPIRIN 81 MG PO CHEW
324.0000 mg | CHEWABLE_TABLET | Freq: Once | ORAL | Status: AC
  Administered 2014-12-17: 324 mg via ORAL

## 2014-12-17 MED ORDER — METOPROLOL TARTRATE 100 MG PO TABS
100.0000 mg | ORAL_TABLET | Freq: Two times a day (BID) | ORAL | Status: DC
  Administered 2014-12-17 – 2014-12-18 (×2): 100 mg via ORAL
  Filled 2014-12-17 (×2): qty 1

## 2014-12-17 MED ORDER — ACETAMINOPHEN 325 MG PO TABS: 650.0000 mg | ORAL_TABLET | Freq: Four times a day (QID) | ORAL | Status: DC | PRN

## 2014-12-17 MED ORDER — ASPIRIN EC 81 MG PO TBEC
81.0000 mg | DELAYED_RELEASE_TABLET | Freq: Every day | ORAL | Status: DC
  Administered 2014-12-18: 81 mg via ORAL
  Filled 2014-12-17: qty 1

## 2014-12-17 MED ORDER — VITAMIN B-1 100 MG PO TABS
100.0000 mg | ORAL_TABLET | Freq: Every day | ORAL | Status: DC
  Administered 2014-12-18: 100 mg via ORAL
  Filled 2014-12-17: qty 1

## 2014-12-17 MED ORDER — THIAMINE HCL 100 MG/ML IJ SOLN
Freq: Once | INTRAVENOUS | Status: AC
  Administered 2014-12-17: 23:00:00 via INTRAVENOUS
  Filled 2014-12-17: qty 1000

## 2014-12-17 MED ORDER — FOLIC ACID 1 MG PO TABS
1.0000 mg | ORAL_TABLET | Freq: Every day | ORAL | Status: DC
  Administered 2014-12-18: 1 mg via ORAL
  Filled 2014-12-17: qty 1

## 2014-12-17 MED ORDER — LISINOPRIL 20 MG PO TABS
20.0000 mg | ORAL_TABLET | Freq: Every day | ORAL | Status: DC
  Administered 2014-12-17 – 2014-12-18 (×2): 20 mg via ORAL
  Filled 2014-12-17 (×2): qty 1

## 2014-12-17 MED ORDER — MOMETASONE FURO-FORMOTEROL FUM 100-5 MCG/ACT IN AERO
2.0000 | INHALATION_SPRAY | Freq: Two times a day (BID) | RESPIRATORY_TRACT | Status: DC
  Administered 2014-12-17 – 2014-12-18 (×2): 2 via RESPIRATORY_TRACT
  Filled 2014-12-17: qty 8.8

## 2014-12-17 MED ORDER — SODIUM CHLORIDE 0.9 % IJ SOLN
3.0000 mL | Freq: Two times a day (BID) | INTRAMUSCULAR | Status: DC
  Administered 2014-12-17: 3 mL via INTRAVENOUS

## 2014-12-17 MED ORDER — THIAMINE HCL 100 MG/ML IJ SOLN: 100.0000 mg | Freq: Every day | INTRAMUSCULAR | Status: DC

## 2014-12-17 MED ORDER — CLOPIDOGREL BISULFATE 75 MG PO TABS
75.0000 mg | ORAL_TABLET | Freq: Every day | ORAL | Status: DC
  Administered 2014-12-17 – 2014-12-18 (×2): 75 mg via ORAL
  Filled 2014-12-17 (×2): qty 1

## 2014-12-17 MED ORDER — LORAZEPAM 1 MG PO TABS: 1.0000 mg | ORAL_TABLET | Freq: Four times a day (QID) | ORAL | Status: DC | PRN

## 2014-12-17 MED ORDER — LORAZEPAM 1 MG PO TABS
1.0000 mg | ORAL_TABLET | Freq: Once | ORAL | Status: AC
  Administered 2014-12-17: 1 mg via ORAL
  Filled 2014-12-17: qty 1

## 2014-12-17 NOTE — ED Notes (Signed)
Patient to ED with c/o chest pain that has been present for 2- 3 days. Patient states she has been drinking large amounts of ETOH recently. Patient states " I have been drinking and sleeping, I haven't eaten anything for 5 days". Patient states " I have been coming here multiple times for chest pain but they tell me that there is nothing wrong." Patient is alert and orienrted. Patient sates he has a recent history of Bypass surgery and stent placement.

## 2014-12-17 NOTE — ED Provider Notes (Signed)
[facility] Emergency Department Provider Note  ____________________________________________  Time seen: Approximately 7:10 PM  I have reviewed the triage vital signs and the nursing notes.   HISTORY  Chief Complaint Chest Pain    HPI [name] is a 64 y.o. male with history of COPD, hypertension, previous CVA, coronary artery disease status post bypass graft in 2012, also status post coronary stent 3 months ago who presents for evaluation of chest pain. Patient reports that he has had gradual onset intermittent sharp and aching chest pain in the left chest radiating into the left arm for the past 2 days. Currently his pain is resolved, severity is 0 out of 10. He has chronic shortness of breath which is not increased from baseline. Chest pain does not radiate to the back, is not ripping or tearing in nature, does not radiate to the feet, is not pleuritic in nature. He reports that his chest pain feels similar to prior heart attack. He reports that he has also been drinking heavily because he has been under a lot of stress. He denies any history of seizures, complicated withdrawals or delirium tremens.   Past Medical History  Diagnosis Date  . Stroke   . COPD (chronic obstructive pulmonary disease)   . Hypertension   . Heart attack   . CAD (coronary artery disease)   . Bipolar affective disorder   . COPD (chronic obstructive pulmonary disease)   . Alcohol abuse     Patient Active Problem List   Diagnosis Date Noted  . Unstable angina 12/17/2014  . CAD (coronary artery disease) 12/17/2014  . Bipolar affective disorder 12/17/2014  . HTN (hypertension) 12/17/2014  . COPD (chronic obstructive pulmonary disease) 12/17/2014  . Alcohol abuse 12/17/2014    Past Surgical History  Procedure Laterality Date  . Coronary artery bypass graft    . Coronary stent placement    . Appendectomy    . Cholecystectomy      Current Outpatient Rx  Name  Route  Sig   Dispense  Refill  . albuterol (PROVENTIL HFA;VENTOLIN HFA) 108 (90 BASE) MCG/ACT inhaler   Inhalation   Inhale 2 puffs into the lungs every 6 (six) hours as needed for wheezing or shortness of breath.          Marland Kitchen aspirin EC 81 MG tablet   Oral   Take 81 mg by mouth daily.         Marland Kitchen atorvastatin (LIPITOR) 80 MG tablet   Oral   Take 80 mg by mouth at bedtime.          . clopidogrel (PLAVIX) 75 MG tablet   Oral   Take 75 mg by mouth daily.         Marland Kitchen lisinopril (PRINIVIL,ZESTRIL) 20 MG tablet   Oral   Take 20 mg by mouth daily.         . metoprolol (LOPRESSOR) 100 MG tablet   Oral   Take 100 mg by mouth 2 (two) times daily.         . potassium chloride SA (K-DUR,KLOR-CON) 20 MEQ tablet   Oral   Take 20 mEq by mouth daily.           Allergies Review of patient's allergies indicates no known allergies.  Family History  Problem Relation Age of Onset  . Heart attack Father   . Colon cancer Mother     Social History History  Substance Use Topics  . Smoking status: Current Every  Day Smoker -- 0.50 packs/day for 33 years    Types: Cigarettes  . Smokeless tobacco: Not on file  . Alcohol Use: 0.0 oz/week    0 Standard drinks or equivalent per week    Review of Systems Constitutional: No fever/chills Eyes: No visual changes. ENT: No sore throat. Cardiovascular: + chest pain. Respiratory: Denies shortness of breath. Gastrointestinal: No abdominal pain.  No nausea, no vomiting.  No diarrhea.  No constipation. Genitourinary: Negative for dysuria. Musculoskeletal: Negative for back pain. Skin: Negative for rash. Neurological: Negative for headaches, focal weakness or numbness.  10-point ROS otherwise negative.  ____________________________________________   PHYSICAL EXAM:   Filed Vitals:   12/17/14 1927 12/17/14 2000 12/17/14 2030  BP: 137/98 146/99 153/71  Pulse: 103 103 93  Temp: 98.3 F (36.8 C)    TempSrc: Oral    Resp: 18 10 8   Height: 5'  11" (1.803 m)    Weight: 220 lb (99.791 kg)    SpO2: 98% 97% 97%   Filed Vitals:   12/17/14 2030  BP: 153/71  Pulse: 93  Temp:   Resp: 8     Constitutional: Alert and oriented. Well appearing and in no acute distress. Eyes: Conjunctivae are normal. PERRL. EOMI. Head: Atraumatic. Nose: No congestion/rhinnorhea. Mouth/Throat: Mucous membranes are moist.  Oropharynx non-erythematous. Neck: No stridor.   Cardiovascular: tachycardic rate, regular rhythm. Grossly normal heart sounds.  Good peripheral circulation. Respiratory: Normal respiratory effort.  No retractions. Lungs CTAB. Gastrointestinal: Soft and nontender. No distention. No abdominal bruits. No CVA tenderness. Genitourinary: deferred Musculoskeletal: No lower extremity tenderness nor edema.  No joint effusions. Neurologic:  Normal speech and language. No gross focal neurologic deficits are appreciated. Speech is normal. No gait instability. Skin:  Skin is warm, dry and intact. No rash noted. Psychiatric: Mood and affect are normal. Speech and behavior are normal.  ____________________________________________   LABS (all labs ordered are listed, but only abnormal results are displayed)  Labs Reviewed  CBC WITH DIFFERENTIAL/PLATELET - Abnormal; Notable for the following:    RBC 4.22 (*)    RDW 14.7 (*)    All other components within normal limits  COMPREHENSIVE METABOLIC PANEL - Abnormal; Notable for the following:    Potassium 3.1 (*)    Calcium 8.0 (*)    Total Protein 6.0 (*)    Albumin 3.2 (*)    ALT 9 (*)    All other components within normal limits  ETHANOL - Abnormal; Notable for the following:    Alcohol, Ethyl (B) 245 (*)    All other components within normal limits  TROPONIN I   ____________________________________________  EKG  ED ECG REPORT I, Gayla Doss, the attending physician, personally viewed and interpreted this ECG.   Date: 12/17/2014  EKG Time: 19:21  Rate: 103  Rhythm:  nonspecific ST and T waves changes, sinus tachycardia  Axis:   Intervals:none  ST&T Change: Nonspecific ST and T-wave abnormality, T-wave inversion in V6  ____________________________________________  RADIOLOGY  CXR FINDINGS: Heart size and pulmonary vascularity are normal and the lungs are clear. Prior CABG. No acute osseous abnormality. No effusions.  IMPRESSION: No active cardiopulmonary disease.  ____________________________________________   PROCEDURES  Procedure(s) performed: None  Critical Care performed: No  ____________________________________________   INITIAL IMPRESSION / ASSESSMENT AND PLAN / ED COURSE  Pertinent labs & imaging results that were available during my care of the patient were reviewed by me and considered in my medical decision making (see chart for details).  Russell Grumbles  Singleton is a 64 y.o. male with history of COPD, hypertension, previous CVA, coronary artery disease status post bypass graft in 2012, also status post coronary stent 3 months ago who presents for evaluation of chest pain. On exam, he is generally well-appearing and in no acute distress. Mildly tachycardic on arrival but vital signs otherwise stable. Currently chest pain-free. He is complaining of chest pain which feels similar to her prior heart attack. He is at very high risk for recurrent myocardial infarction. He had a negative Myoview stress test in February of this year and then required coronary artery stenting just a month later. His ethanol level is elevated at 245 though he appears clinically sober. Chest pain is not consistent with acute aortic dissection or pulmonary embolism. Aspirin ordered. Discussed with hospitalist for admission for cardiology consult, cycling of cardiac enzymes. ____________________________________________   FINAL CLINICAL IMPRESSION(S) / ED DIAGNOSES  Final diagnoses:  Chest pain, unspecified chest pain type      Gayla Doss, MD 12/17/14 2054

## 2014-12-17 NOTE — H&P (Signed)
[facility] - [place] at [facility]   PATIENT NAME: [name]    MR#:  [id number]  DATE OF BIRTH:  Oct 03, 1950  DATE OF ADMISSION:  12/17/2014  PRIMARY CARE PHYSICIAN: Myrtis Ser   REQUESTING/REFERRING PHYSICIAN: Inocencio Homes  CHIEF COMPLAINT:   Chief Complaint  Patient presents with  . Chest Pain    HISTORY OF PRESENT ILLNESS:  [name]  is a 64 y.o. male who presents with chest pain. This patient has a known history of coronary artery disease status post CABG and prior stenting. Of note most recently he had some chest pain in February this year, had a negative stress test but then required stenting one month later, denoting significantly high risk of cardiac cause for this patient's chest pain. In the ED his first troponin was negative, but his EKG showed some lateral nonspecific ST changes. Patient states that his chest pain was sharp, left-sided with radiation to his left arm, not associated with nausea, vomiting, blurred vision, headache, diaphoresis. His chest pain has resolved by the time of this interview. Hospitalists were called for admission for unstable angina. Of note the patient also has been drinking significantly in the last several days, but has not had anything to drink for the last 10 hours. His alcohol level was in the 200s.  PAST MEDICAL HISTORY:   Past Medical History  Diagnosis Date  . Stroke   . COPD (chronic obstructive pulmonary disease)   . Hypertension   . Heart attack   . CAD (coronary artery disease)   . Bipolar affective disorder   . COPD (chronic obstructive pulmonary disease)   . Alcohol abuse     PAST SURGICAL HISTORY:   Past Surgical History  Procedure Laterality Date  . Coronary artery bypass graft    . Coronary stent placement    . Appendectomy    . Cholecystectomy      SOCIAL HISTORY:   History  Substance Use Topics  . Smoking status: Current Every Day Smoker -- 0.50 packs/day for 33 years    Types:  Cigarettes  . Smokeless tobacco: Not on file  . Alcohol Use: 0.0 oz/week    0 Standard drinks or equivalent per week    FAMILY HISTORY:   Family History  Problem Relation Age of Onset  . Heart attack Father   . Colon cancer Mother     DRUG ALLERGIES:  No Known Allergies  MEDICATIONS AT HOME:   Prior to Admission medications   Medication Sig Start Date End Date Taking? Authorizing Provider  albuterol (PROVENTIL HFA;VENTOLIN HFA) 108 (90 BASE) MCG/ACT inhaler Inhale 2 puffs into the lungs every 6 (six) hours as needed for wheezing or shortness of breath.    Yes Historical Provider, MD  aspirin EC 81 MG tablet Take 81 mg by mouth daily.   Yes Historical Provider, MD  atorvastatin (LIPITOR) 80 MG tablet Take 80 mg by mouth at bedtime.    Yes Historical Provider, MD  chlordiazePOXIDE (LIBRIUM) 25 MG capsule Take 25 mg by mouth 3 (three) times daily as needed for anxiety.   Yes Historical Provider, MD  clopidogrel (PLAVIX) 75 MG tablet Take 75 mg by mouth daily.   Yes Historical Provider, MD  lisinopril (PRINIVIL,ZESTRIL) 20 MG tablet Take 20 mg by mouth daily.   Yes Historical Provider, MD  metoprolol (LOPRESSOR) 100 MG tablet Take 100 mg by mouth 2 (two) times daily.   Yes Historical Provider, MD  potassium chloride SA (K-DUR,KLOR-CON) 20 MEQ tablet  Take 20 mEq by mouth daily.   Yes Historical Provider, MD    REVIEW OF SYSTEMS:  Review of Systems  Constitutional: Negative for fever, chills, weight loss and malaise/fatigue.  HENT: Negative for ear pain, hearing loss and tinnitus.   Eyes: Negative for blurred vision, double vision, pain and redness.  Respiratory: Negative for cough, hemoptysis and shortness of breath.   Cardiovascular: Positive for chest pain (see history of present illness for details). Negative for palpitations, orthopnea and leg swelling.  Gastrointestinal: Negative for nausea, vomiting, abdominal pain, diarrhea and constipation.  Genitourinary: Negative for  dysuria, frequency and hematuria.  Musculoskeletal: Negative for back pain, joint pain and neck pain.  Skin:       No acne, rash, or lesions  Neurological: Negative for dizziness, tremors, focal weakness and weakness.  Endo/Heme/Allergies: Negative for polydipsia. Does not bruise/bleed easily.  Psychiatric/Behavioral: Negative for depression. The patient is nervous/anxious. The patient does not have insomnia.      VITAL SIGNS:   Filed Vitals:   12/17/14 1927 12/17/14 2000 12/17/14 2030  BP: 137/98 146/99 153/71  Pulse: 103 103 93  Temp: 98.3 F (36.8 C)    TempSrc: Oral    Resp: 18 10 8   Height: 5\' 11"  (1.803 m)    Weight: 99.791 kg (220 lb)    SpO2: 98% 97% 97%   Wt Readings from Last 3 Encounters:  12/17/14 99.791 kg (220 lb)    PHYSICAL EXAMINATION:  Physical Exam  Constitutional: He is oriented to person, place, and time. He appears well-developed and well-nourished. No distress.  HENT:  Head: Normocephalic and atraumatic.  Mouth/Throat: Oropharynx is clear and moist.  Eyes: Conjunctivae and EOM are normal. Pupils are equal, round, and reactive to light. No scleral icterus.  Neck: Normal range of motion. Neck supple. No JVD present. No thyromegaly present.  Cardiovascular: Normal rate, regular rhythm and intact distal pulses.  Exam reveals no gallop and no friction rub.   No murmur heard. Respiratory: Effort normal and breath sounds normal. No respiratory distress. He has no wheezes. He has no rales.  GI: Soft. Bowel sounds are normal. He exhibits no distension. There is no tenderness.  Musculoskeletal: Normal range of motion. He exhibits no edema.  No arthritis, no gout  Lymphadenopathy:    He has no cervical adenopathy.  Neurological: He is alert and oriented to person, place, and time. No cranial nerve deficit.  No dysarthria, no aphasia  Skin: Skin is warm and dry. No rash noted. No erythema.  Psychiatric: He has a normal mood and affect. His behavior is normal.  Judgment and thought content normal.    LABORATORY PANEL:   CBC  Recent Labs Lab 12/17/14 1935  WBC 9.1  HGB 14.0  HCT 40.2  PLT 306   ------------------------------------------------------------------------------------------------------------------  Chemistries   Recent Labs Lab 12/17/14 1935  NA 141  K 3.1*  CL 103  CO2 28  GLUCOSE 85  BUN 8  CREATININE 1.01  CALCIUM 8.0*  AST 19  ALT 9*  ALKPHOS 90  BILITOT 0.3   ------------------------------------------------------------------------------------------------------------------  Cardiac Enzymes  Recent Labs Lab 12/17/14 1935  TROPONINI <0.03   ------------------------------------------------------------------------------------------------------------------  RADIOLOGY:  Dg Chest 2 View  12/17/2014   CLINICAL DATA:  Chest pain.  EXAM: CHEST  2 VIEW  COMPARISON:  08/19/2014 and 02/21/2014  FINDINGS: Heart size and pulmonary vascularity are normal and the lungs are clear. Prior CABG. No acute osseous abnormality. No effusions.  IMPRESSION: No active cardiopulmonary disease.  Electronically Signed   By: Francene Boyers M.D.   On: 12/17/2014 20:13    EKG:   Orders placed or performed during the hospital encounter of 12/17/14  . ED EKG  . ED EKG    IMPRESSION AND PLAN:  Principal Problem:   Unstable angina - in the setting of significant known coronary artery disease. We will admit this patient, trend his cardiac enzymes, get an echocardiogram, cardiology consult in the morning. Active Problems:   CAD (coronary artery disease) - continue his appropriate home medications, including Plavix.   Alcohol abuse - CIWA and withdrawal protocol.   HTN (hypertension) - continue home medications   COPD (chronic obstructive pulmonary disease) - patient states that he was using Advair and Spiriva as well as his rescue inhaler in the past, but his prescription ran out so he has not been on the Advair and Spiriva. We will  restart these medications while inpatient.  All the records are reviewed and case discussed with ED provider. Management plans discussed with the patient and/or family.  DVT PROPHYLAXIS: SubQ lovenox  ADMISSION STATUS: Observation  CODE STATUS: Full  TOTAL TIME TAKING CARE OF THIS PATIENT: 45 minutes.    Laurice Kimmons, Nnamdi FIELDING 12/17/2014, 9:13 PM  [facility]  Office  [phone]  CC: Primary care physician; Myrtis Ser

## 2014-12-18 ENCOUNTER — Observation Stay: Admit: 2014-12-18 | Payer: Medicare PPO

## 2014-12-18 ENCOUNTER — Encounter
Admission: EM | Disposition: A | Discharge status: Home / Self Care | Payer: Self-pay | Source: Home / Self Care | Attending: Student

## 2014-12-18 HISTORY — PX: CARDIAC CATHETERIZATION: SHX172

## 2014-12-18 LAB — CBC
HEMATOCRIT: 40.2 % (ref 40.0–52.0)
Hemoglobin: 13.8 g/dL (ref 13.0–18.0)
MCH: 32.8 pg (ref 26.0–34.0)
MCHC: 34.3 g/dL (ref 32.0–36.0)
MCV: 95.7 fL (ref 80.0–100.0)
Platelets: 301 10*3/uL (ref 150–440)
RBC: 4.2 MIL/uL — ABNORMAL LOW (ref 4.40–5.90)
RDW: 14.7 % — ABNORMAL HIGH (ref 11.5–14.5)
WBC: 9.6 10*3/uL (ref 3.8–10.6)

## 2014-12-18 LAB — BASIC METABOLIC PANEL
Anion gap: 13 (ref 5–15)
BUN: 10 mg/dL (ref 6–20)
CO2: 25 mmol/L (ref 22–32)
CREATININE: 0.94 mg/dL (ref 0.61–1.24)
Calcium: 8.2 mg/dL — ABNORMAL LOW (ref 8.9–10.3)
Chloride: 106 mmol/L (ref 101–111)
GFR calc non Af Amer: >60 mL/min (ref >60)
Glucose, Bld: 93 mg/dL (ref 65–99)
Potassium: 3.3 mmol/L — ABNORMAL LOW (ref 3.5–5.1)
Sodium: 144 mmol/L (ref 135–145)

## 2014-12-18 LAB — TROPONIN I: Troponin I: <0.03 ng/mL (ref <0.031)

## 2014-12-18 SURGERY — LEFT HEART CATH AND CORONARY ANGIOGRAPHY
Anesthesia: Moderate Sedation | Laterality: Left

## 2014-12-18 MED ORDER — HYDRALAZINE HCL 20 MG/ML IJ SOLN
10.0000 mg | Freq: Once | INTRAMUSCULAR | Status: AC
  Administered 2014-12-18: 10 mg via INTRAVENOUS
  Filled 2014-12-18: qty 1

## 2014-12-18 MED ORDER — SODIUM CHLORIDE 0.9 % IJ SOLN: 3.0000 mL | INTRAMUSCULAR | Status: DC | PRN

## 2014-12-18 MED ORDER — MIDAZOLAM HCL 2 MG/2ML IJ SOLN
INTRAMUSCULAR | Status: AC
  Filled 2014-12-18: qty 2

## 2014-12-18 MED ORDER — HYDRALAZINE HCL 20 MG/ML IJ SOLN
INTRAMUSCULAR | Status: AC
  Filled 2014-12-18: qty 1

## 2014-12-18 MED ORDER — SODIUM CHLORIDE 0.9 % WEIGHT BASED INFUSION: 3.0000 mL/kg/h | INTRAVENOUS | Status: DC

## 2014-12-18 MED ORDER — HYDRALAZINE HCL 20 MG/ML IJ SOLN
INTRAMUSCULAR | Status: DC | PRN
Start: 2014-12-18 — End: 2014-12-18
  Administered 2014-12-18: 10 mg via INTRAVENOUS

## 2014-12-18 MED ORDER — SODIUM CHLORIDE 0.9 % WEIGHT BASED INFUSION: 1.0000 mL/kg/h | INTRAVENOUS | Status: DC

## 2014-12-18 MED ORDER — ADULT MULTIVITAMIN W/MINERALS CH: 1.0000 | ORAL_TABLET | Freq: Every day | ORAL | Status: DC

## 2014-12-18 MED ORDER — IOHEXOL 300 MG/ML  SOLN
INTRAMUSCULAR | Status: DC | PRN
  Administered 2014-12-18: 30 mL via INTRA_ARTERIAL
  Administered 2014-12-18: 155 mL via INTRA_ARTERIAL

## 2014-12-18 MED ORDER — SODIUM CHLORIDE 0.9 % WEIGHT BASED INFUSION: 3.0000 mL/kg/h | INTRAVENOUS | Status: AC

## 2014-12-18 MED ORDER — ASPIRIN 81 MG PO CHEW: 81.0000 mg | CHEWABLE_TABLET | ORAL | Status: DC

## 2014-12-18 MED ORDER — ONDANSETRON HCL 4 MG/2ML IJ SOLN: 4.0000 mg | Freq: Four times a day (QID) | INTRAMUSCULAR | Status: DC | PRN

## 2014-12-18 MED ORDER — SODIUM CHLORIDE 0.9 % IV SOLN: 250.0000 mL | INTRAVENOUS | Status: DC | PRN

## 2014-12-18 MED ORDER — LABETALOL HCL 5 MG/ML IV SOLN
INTRAVENOUS | Status: DC | PRN
  Administered 2014-12-18 (×2): 20 mg via INTRAVENOUS

## 2014-12-18 MED ORDER — SODIUM CHLORIDE 0.9 % IJ SOLN
3.0000 mL | Freq: Two times a day (BID) | INTRAMUSCULAR | Status: DC
Start: 2014-12-18 — End: 2014-12-18

## 2014-12-18 MED ORDER — LABETALOL HCL 5 MG/ML IV SOLN
20.0000 mg | INTRAVENOUS | Status: DC | PRN
  Administered 2014-12-18: 20 mg via INTRAVENOUS
  Filled 2014-12-18: qty 4

## 2014-12-18 MED ORDER — LORAZEPAM 2 MG/ML IJ SOLN
1.0000 mg | Freq: Once | INTRAMUSCULAR | Status: AC
  Administered 2014-12-18: 1 mg via INTRAVENOUS
  Filled 2014-12-18: qty 1

## 2014-12-18 MED ORDER — LABETALOL HCL 5 MG/ML IV SOLN
INTRAVENOUS | Status: AC
  Filled 2014-12-18: qty 4

## 2014-12-18 MED ORDER — ACETAMINOPHEN 325 MG PO TABS: 650.0000 mg | ORAL_TABLET | ORAL | Status: DC | PRN

## 2014-12-18 MED ORDER — SODIUM CHLORIDE 0.9 % IJ SOLN: 3.0000 mL | Freq: Two times a day (BID) | INTRAMUSCULAR | Status: DC

## 2014-12-18 MED ORDER — SODIUM CHLORIDE 0.9 % WEIGHT BASED INFUSION
1.0000 mL/kg/h | INTRAVENOUS | Status: DC
Start: 2014-12-19 — End: 2014-12-18

## 2014-12-18 MED ORDER — POTASSIUM CHLORIDE CRYS ER 20 MEQ PO TBCR
20.0000 meq | EXTENDED_RELEASE_TABLET | Freq: Every day | ORAL | Status: DC
  Administered 2014-12-18: 20 meq via ORAL
  Filled 2014-12-18: qty 1

## 2014-12-18 MED ORDER — FENTANYL CITRATE (PF) 100 MCG/2ML IJ SOLN
INTRAMUSCULAR | Status: DC | PRN
  Administered 2014-12-18 (×2): 50 ug via INTRAVENOUS

## 2014-12-18 MED ORDER — VITAMIN B-1 100 MG PO TABS: 100.0000 mg | ORAL_TABLET | Freq: Every day | ORAL | Status: DC

## 2014-12-18 MED ORDER — HYDRALAZINE HCL 50 MG PO TABS
50.0000 mg | ORAL_TABLET | Freq: Four times a day (QID) | ORAL | Status: DC
  Administered 2014-12-18: 50 mg via ORAL
  Filled 2014-12-18: qty 1

## 2014-12-18 MED ORDER — LABETALOL HCL 200 MG PO TABS
200.0000 mg | ORAL_TABLET | Freq: Two times a day (BID) | ORAL | Status: DC
  Administered 2014-12-18: 200 mg via ORAL
  Filled 2014-12-18: qty 1

## 2014-12-18 MED ORDER — HEPARIN (PORCINE) IN NACL 2-0.9 UNIT/ML-% IJ SOLN
INTRAMUSCULAR | Status: AC
  Filled 2014-12-18: qty 500

## 2014-12-18 MED ORDER — FOLIC ACID 1 MG PO TABS: 1.0000 mg | ORAL_TABLET | Freq: Every day | ORAL | Status: DC

## 2014-12-18 MED ORDER — ASPIRIN 81 MG PO CHEW
81.0000 mg | CHEWABLE_TABLET | ORAL | Status: DC
Start: 2014-12-19 — End: 2014-12-18

## 2014-12-18 MED ORDER — LORAZEPAM 2 MG/ML IJ SOLN
2.0000 mg | INTRAMUSCULAR | Status: DC | PRN
  Administered 2014-12-18: 2 mg via INTRAVENOUS
  Filled 2014-12-18: qty 1

## 2014-12-18 MED ORDER — MIDAZOLAM HCL 2 MG/2ML IJ SOLN
INTRAMUSCULAR | Status: DC | PRN
  Administered 2014-12-18 (×2): 1 mg via INTRAVENOUS
  Administered 2014-12-18: 2 mg via INTRAVENOUS
  Administered 2014-12-18: 1 mg via INTRAVENOUS

## 2014-12-18 MED ORDER — FENTANYL CITRATE (PF) 100 MCG/2ML IJ SOLN
INTRAMUSCULAR | Status: AC
  Filled 2014-12-18: qty 2

## 2014-12-18 SURGICAL SUPPLY — 12 items
CATH INFINITI 5 FR 3DRC (CATHETERS) ×4 IMPLANT
CATH INFINITI 5 FR IM (CATHETERS) ×2 IMPLANT
CATH INFINITI 5FR ANG PIGTAIL (CATHETERS) ×2 IMPLANT
CATH INFINITI 5FR JL4 (CATHETERS) ×2 IMPLANT
CATH INFINITI JR4 5F (CATHETERS) ×2 IMPLANT
DEVICE CLOSURE MYNXGRIP 5F (Vascular Products) ×2 IMPLANT
KIT MANI 3VAL PERCEP (MISCELLANEOUS) ×2 IMPLANT
NEEDLE PERC 18GX7CM (NEEDLE) ×2 IMPLANT
PACK CARDIAC CATH (CUSTOM PROCEDURE TRAY) ×2 IMPLANT
SHEATH AVANTI 5FR X 11CM (SHEATH) ×2 IMPLANT
WIRE EMERALD 3MM-J .035X150CM (WIRE) ×2 IMPLANT
WIRE EMERALD 3MM-J .035X260CM (WIRE) ×2 IMPLANT

## 2014-12-18 NOTE — Progress Notes (Signed)
pts blood pressure remains elevated post medications/ MD paged to make aware/ new orders recived

## 2014-12-18 NOTE — Progress Notes (Signed)
Report to jessica Check right groin for bleeding or hematoma.  Patient will be on bedrest for 1 hours post sheath pull---out of bed at 16:40.  Bilateral pulses are 2's DP's.

## 2014-12-18 NOTE — Progress Notes (Signed)
MD paged to make aware of high BP and dose of ativan needed before schedule time

## 2014-12-18 NOTE — Progress Notes (Signed)
Pt refuses bed alarm/ encouraged to ask for assistance before getting out of bed/ urinal and call bell within reach

## 2014-12-18 NOTE — Progress Notes (Signed)
[facility] - [place] at [facility]   PATIENT NAME: [name]    MR#:  [id number]  DATE OF BIRTH:  1950/12/24  SUBJECTIVE:  CHIEF COMPLAINT:   Chief Complaint  Patient presents with  . Chest Pain   Chest pain improved. Has been hypertensive today, I think he is withdrawing from alcohol. The time of this examination he is comfortable, talkative.  REVIEW OF SYSTEMS:   Review of Systems  Constitutional: Negative for fever.  Respiratory: Positive for cough. Negative for shortness of breath.   Cardiovascular: Positive for chest pain. Negative for palpitations, orthopnea, claudication, leg swelling and PND.  Gastrointestinal: Negative for nausea, vomiting and abdominal pain.  Genitourinary: Negative for dysuria.  Neurological: Negative for tremors.  Psychiatric/Behavioral: The patient is nervous/anxious.     DRUG ALLERGIES:  No Known Allergies  VITALS:  Blood pressure 190/106, pulse 91, temperature 98.2 F (36.8 C), temperature source Oral, resp. rate 15, height 5\' 11"  (1.803 m), weight 90.81 kg (200 lb 3.2 oz), SpO2 97 %.  PHYSICAL EXAMINATION:  GENERAL:  64 y.o.-year-old patient lying in the bed with no acute distress.  EYES: Pupils equal, round, reactive to light and accommodation. No scleral icterus. Extraocular muscles intact.  HEENT: Head atraumatic, normocephalic. Oropharynx and nasopharynx clear. Mucous membranes moist NECK:  Supple, no jugular venous distention. No thyroid enlargement, no tenderness.  LUNGS: Normal breath sounds bilaterally, no wheezing, rales, rhonchi or crepitation. No use of accessory muscles of respiration.  CARDIOVASCULAR: S1, S2 normal. No murmurs, rubs, or gallops.  ABDOMEN: Soft, nontender, nondistended. Bowel sounds present. No organomegaly or mass.  EXTREMITIES: No pedal edema, cyanosis, or clubbing.  NEUROLOGIC: Cranial nerves II through XII are intact. Muscle strength 5/5 in all extremities. Sensation intact. Gait  not checked.  PSYCHIATRIC: The patient is alert and oriented x 3. Talkative but not overtly anxious SKIN: No obvious rash, lesion, or ulcer.    LABORATORY PANEL:   CBC  Recent Labs Lab 12/18/14 0528  WBC 9.6  HGB 13.8  HCT 40.2  PLT 301   ------------------------------------------------------------------------------------------------------------------  Chemistries   Recent Labs Lab 12/17/14 1935 12/18/14 0528  NA 141 144  K 3.1* 3.3*  CL 103 106  CO2 28 25  GLUCOSE 85 93  BUN 8 10  CREATININE 1.01 0.94  CALCIUM 8.0* 8.2*  AST 19  --   ALT 9*  --   ALKPHOS 90  --   BILITOT 0.3  --    ------------------------------------------------------------------------------------------------------------------  Cardiac Enzymes  Recent Labs Lab 12/18/14 0954  TROPONINI <0.03   ------------------------------------------------------------------------------------------------------------------  RADIOLOGY:  Dg Chest 2 View  12/17/2014   CLINICAL DATA:  Chest pain.  EXAM: CHEST  2 VIEW  COMPARISON:  08/19/2014 and 02/21/2014  FINDINGS: Heart size and pulmonary vascularity are normal and the lungs are clear. Prior CABG. No acute osseous abnormality. No effusions.  IMPRESSION: No active cardiopulmonary disease.   Electronically Signed   By: Francene Boyers M.D.   On: 12/17/2014 20:13    EKG:   Orders placed or performed during the hospital encounter of 12/17/14  . ED EKG  . ED EKG    ASSESSMENT AND PLAN:   Principal Problem:   Unstable angina Active Problems:   CAD (coronary artery disease)   HTN (hypertension)   COPD (chronic obstructive pulmonary disease)   Alcohol abuse  #1 unstable angina: Troponins negative, no EKG changes or events on telemetry. He is still having chest pain. Has been seen by cardiology and plan  is for cardiac catheterization this afternoon. He does have a history of coronary artery disease, PCI and stenting with CABG in 2012. Continue aspirin,  Plavix, statin, beta blocker. Catheterization today.  #2 alcohol withdrawal: He is hypertensive and very talkative. No tachycardia, diaphoresis, tremor. No seizure or hallucination. I do think he is withdrawing he states very candidly that he drinks at least 6 alcoholic beverages daily. Will increase his CIWA coverage. He does not have any intention of quitting drinking when he goes home. We discussed the strain that this places on his heart. He is not contemplating quitting drinking.  #3 hypertension: Currently uncontrolled but I think this is due to alcohol withdrawal. See discussion above. Continue with treatment of alcohol withdrawal, continue with metoprolol, lisinopril, when necessary hydralazine.  #4 COPD: No exacerbation currently. Continue with Spiriva, DULERA, albuterol  #5: Patient noted significant financial strain, inability to access medications, lack of local primary care provider. Will ask for care management consultation for assistance  All the records are reviewed and case discussed with Care Management/Social Workerr. Management plans discussed with the patient, family and they are in agreement.  CODE STATUS: FULL  TOTAL TIME TAKING CARE OF THIS PATIENT: 40 minutes.   POSSIBLE D/C IN 1-2 DAYS, DEPENDING ON CLINICAL CONDITION.   Elby Showers M.D on 12/18/2014 at 2:07 PM  Between 7am to 6pm - Pager - [phone]  After 6pm go to [email or web address] - password [email or web address] [facility]  [place] [facility]  Office  [phone]  CC: Primary care physician; Laurier Nancy, MD

## 2014-12-18 NOTE — Progress Notes (Signed)
[name] is a 64 y.o. male  [id number]  Primary Cardiologist: Adrian Blackwater Reason for Consultation: Chest pain  HPI: This is a 64 year old white male with a past medical history of PCI and stenting and CABG in 2012 at [facility] presented to the emergency room with chest pain. Chest pain was at rest associated with shortness of breath and diaphoresis. Patient was admitted with a diagnosis of unstable angina.   Review of Systems: Review of Systems  Cardiovascular: Positive for chest pain, palpitations and PND.  All other systems reviewed and are negative.     Past Medical History  Diagnosis Date  . Stroke   . COPD (chronic obstructive pulmonary disease)   . Hypertension   . Heart attack   . CAD (coronary artery disease)   . Bipolar affective disorder   . COPD (chronic obstructive pulmonary disease)   . Alcohol abuse     Medications Prior to Admission  Medication Sig Dispense Refill  . albuterol (PROVENTIL HFA;VENTOLIN HFA) 108 (90 BASE) MCG/ACT inhaler Inhale 2 puffs into the lungs every 6 (six) hours as needed for wheezing or shortness of breath.     Marland Kitchen aspirin EC 81 MG tablet Take 81 mg by mouth daily.    Marland Kitchen atorvastatin (LIPITOR) 80 MG tablet Take 80 mg by mouth at bedtime.     . chlordiazePOXIDE (LIBRIUM) 25 MG capsule Take 25 mg by mouth 3 (three) times daily as needed for anxiety.    . clopidogrel (PLAVIX) 75 MG tablet Take 75 mg by mouth daily.    Marland Kitchen lisinopril (PRINIVIL,ZESTRIL) 20 MG tablet Take 20 mg by mouth daily.    . metoprolol (LOPRESSOR) 100 MG tablet Take 100 mg by mouth 2 (two) times daily.    . potassium chloride SA (K-DUR,KLOR-CON) 20 MEQ tablet Take 20 mEq by mouth daily.       Marland Kitchen aspirin EC  81 mg Oral Daily  . atorvastatin  80 mg Oral QHS  . clopidogrel  75 mg Oral Daily  . enoxaparin (LOVENOX) injection  40 mg Subcutaneous Q24H  . folic acid  1 mg Oral Daily  . lisinopril  20 mg Oral Daily  . metoprolol  100 mg Oral BID  .  mometasone-formoterol  2 puff Inhalation BID  . multivitamin with minerals  1 tablet Oral Daily  . sodium chloride  3 mL Intravenous Q12H  . thiamine  100 mg Oral Daily   Or  . thiamine  100 mg Intravenous Daily  . tiotropium  18 mcg Inhalation Daily    Infusions:    No Known Allergies  History   Social History  . Marital Status: Single    Spouse Name: N/A  . Number of Children: N/A  . Years of Education: N/A   Occupational History  . Not on file.   Social History Main Topics  . Smoking status: Current Every Day Smoker -- 0.50 packs/day for 33 years    Types: Cigarettes  . Smokeless tobacco: Not on file  . Alcohol Use: 0.0 oz/week    0 Standard drinks or equivalent per week  . Drug Use: No  . Sexual Activity: Not on file   Other Topics Concern  . Not on file   Social History Narrative  . No narrative on file    Family History  Problem Relation Age of Onset  . Heart attack Father   . Colon cancer Mother     PHYSICAL EXAM: Filed Vitals:  12/18/14 0834  BP: 156/73  Pulse: 99  Temp:   Resp:      Intake/Output Summary (Last 24 hours) at 12/18/14 0844 Last data filed at 12/18/14 0507  Gross per 24 hour  Intake      0 ml  Output      0 ml  Net      0 ml    General:  Well appearing. No respiratory difficulty HEENT: normal Neck: supple. no JVD. Carotids 2+ bilat; no bruits. No lymphadenopathy or thryomegaly appreciated. Cor: PMI nondisplaced. Regular rate & rhythm. No rubs, gallops or murmurs. Lungs: clear Abdomen: soft, nontender, nondistended. No hepatosplenomegaly. No bruits or masses. Good bowel sounds. Extremities: no cyanosis, clubbing, rash, edema Neuro: alert & oriented x 3, cranial nerves grossly intact. moves all 4 extremities w/o difficulty. Affect pleasant.  ECG: ECG shows a normal sinus tachycardia 10 3 bpm left atrial enlargement nonspecific ST-T changes  Results for orders placed or performed during the hospital encounter of 12/17/14  (from the past 24 hour(s))  CBC with Differential     Status: Abnormal   Collection Time: 12/17/14  7:35 PM  Result Value Ref Range   WBC 9.1 3.8 - 10.6 K/uL   RBC 4.22 (L) 4.40 - 5.90 MIL/uL   Hemoglobin 14.0 13.0 - 18.0 g/dL   HCT 16.1 09.6 - 04.5 %   MCV 95.3 80.0 - 100.0 fL   MCH 33.1 26.0 - 34.0 pg   MCHC 34.8 32.0 - 36.0 g/dL   RDW 40.9 (H) 81.1 - 91.4 %   Platelets 306 150 - 440 K/uL   Neutrophils Relative % 63 %   Neutro Abs 5.7 1.4 - 6.5 K/uL   Lymphocytes Relative 31 %   Lymphs Abs 2.8 1.0 - 3.6 K/uL   Monocytes Relative 5 %   Monocytes Absolute 0.4 0.2 - 1.0 K/uL   Eosinophils Relative 1 %   Eosinophils Absolute 0.1 0 - 0.7 K/uL   Basophils Relative 0 %   Basophils Absolute 0.0 0 - 0.1 K/uL  Comprehensive metabolic panel     Status: Abnormal   Collection Time: 12/17/14  7:35 PM  Result Value Ref Range   Sodium 141 135 - 145 mmol/L   Potassium 3.1 (L) 3.5 - 5.1 mmol/L   Chloride 103 101 - 111 mmol/L   CO2 28 22 - 32 mmol/L   Glucose, Bld 85 65 - 99 mg/dL   BUN 8 6 - 20 mg/dL   Creatinine, Ser 7.82 0.61 - 1.24 mg/dL   Calcium 8.0 (L) 8.9 - 10.3 mg/dL   Total Protein 6.0 (L) 6.5 - 8.1 g/dL   Albumin 3.2 (L) 3.5 - 5.0 g/dL   AST 19 15 - 41 U/L   ALT 9 (L) 17 - 63 U/L   Alkaline Phosphatase 90 38 - 126 U/L   Total Bilirubin 0.3 0.3 - 1.2 mg/dL   GFR calc non Af Amer >60 >60 mL/min   GFR calc Af Amer >60 >60 mL/min   Anion gap 10 5 - 15  Troponin I     Status: None   Collection Time: 12/17/14  7:35 PM  Result Value Ref Range   Troponin I <0.03 <0.031 ng/mL  Ethanol     Status: Abnormal   Collection Time: 12/17/14  7:35 PM  Result Value Ref Range   Alcohol, Ethyl (B) 245 (H) <5 mg/dL  Troponin I     Status: None   Collection Time: 12/18/14  2:00 AM  Result  Value Ref Range   Troponin I <0.03 <0.031 ng/mL  Basic metabolic panel     Status: Abnormal   Collection Time: 12/18/14  5:28 AM  Result Value Ref Range   Sodium 144 135 - 145 mmol/L   Potassium 3.3  (L) 3.5 - 5.1 mmol/L   Chloride 106 101 - 111 mmol/L   CO2 25 22 - 32 mmol/L   Glucose, Bld 93 65 - 99 mg/dL   BUN 10 6 - 20 mg/dL   Creatinine, Ser 7.37 0.61 - 1.24 mg/dL   Calcium 8.2 (L) 8.9 - 10.3 mg/dL   GFR calc non Af Amer >60 >60 mL/min   GFR calc Af Amer >60 >60 mL/min   Anion gap 13 5 - 15  CBC     Status: Abnormal   Collection Time: 12/18/14  5:28 AM  Result Value Ref Range   WBC 9.6 3.8 - 10.6 K/uL   RBC 4.20 (L) 4.40 - 5.90 MIL/uL   Hemoglobin 13.8 13.0 - 18.0 g/dL   HCT 10.6 26.9 - 48.5 %   MCV 95.7 80.0 - 100.0 fL   MCH 32.8 26.0 - 34.0 pg   MCHC 34.3 32.0 - 36.0 g/dL   RDW 46.2 (H) 70.3 - 50.0 %   Platelets 301 150 - 440 K/uL   Dg Chest 2 View  12/17/2014   CLINICAL DATA:  Chest pain.  EXAM: CHEST  2 VIEW  COMPARISON:  08/19/2014 and 02/21/2014  FINDINGS: Heart size and pulmonary vascularity are normal and the lungs are clear. Prior CABG. No acute osseous abnormality. No effusions.  IMPRESSION: No active cardiopulmonary disease.   Electronically Signed   By: Francene Boyers M.D.   On: 12/17/2014 20:13     ASSESSMENT AND PLAN: Acute coronary syndrome/unstable angina, patient presented with chest pain at rest with multiple risk factor for coronary artery disease and a history of coronary artery disease status post CABG PCI. Patient continues to have intermittent chest pain including this morning thus we'll set up the patient for cardiac catheterization today thank you very much for referral.  Japheth Diekman A

## 2014-12-18 NOTE — Care Management (Signed)
CM consult for problems affording medications.  Patient very anxious in appearance and does not stay on topic during discussion.  Seems to have flight of ideas.  CM asks a question and patient  Does not  Answer the question.  He gets off topic and starts talking about subjects that had nothing to do with the question.  He does say that he has been put on an inhaler that he can not afford.  It sounds like it is  Spriva.  According to the to the H/P, patient was only on Albuterol prior to admission.   Patient says that he and his wife are having to move because the owner of the house he is renting is having to sell the house due to divorce property settlement.  "It all went through and came down through the [facility] 2 courts.  I have lots of lawyer friends and they say they can not do that."   Patient denies that he will not have shelter at discharge.  Asked pharmacy to assist patient with [employer] home assistance program.  Patient has insurance and denies that he is not able to fill his meds. "It is just that that one inhaler is so expensive even with insurance. 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

## 2014-12-18 NOTE — Significant Event (Signed)
Pt decided to leave the hospital against medical advice. Pt educated on the importance of staying in the hospital until discharged by his doctor. Pt stated he had "drama at home he had to deal with right away". AMA formed signed by patient, myself and the provider. Telemetry and PIVs removed. Pt's wife picked him up from the hospital.

## 2014-12-19 ENCOUNTER — Encounter: Payer: Self-pay | Admitting: Cardiovascular Disease

## 2014-12-26 ENCOUNTER — Other Ambulatory Visit: Payer: Self-pay

## 2014-12-26 ENCOUNTER — Emergency Department
Admission: EM | Admit: 2014-12-26 | Discharge: 2014-12-27 | Disposition: A | Discharge status: Home / Self Care | Payer: Medicare PPO | Attending: Emergency Medicine | Admitting: Emergency Medicine

## 2014-12-26 ENCOUNTER — Encounter: Payer: Self-pay | Admitting: Emergency Medicine

## 2014-12-26 DIAGNOSIS — I10 Essential (primary) hypertension: Secondary | ICD-10-CM | POA: Insufficient documentation

## 2014-12-26 DIAGNOSIS — F10920 Alcohol use, unspecified with intoxication, uncomplicated: Secondary | ICD-10-CM

## 2014-12-26 DIAGNOSIS — Z720 Tobacco use: Secondary | ICD-10-CM | POA: Diagnosis not present

## 2014-12-26 DIAGNOSIS — F10120 Alcohol abuse with intoxication, uncomplicated: Secondary | ICD-10-CM | POA: Insufficient documentation

## 2014-12-26 DIAGNOSIS — F3131 Bipolar disorder, current episode depressed, mild: Secondary | ICD-10-CM | POA: Diagnosis not present

## 2014-12-26 DIAGNOSIS — Z79899 Other long term (current) drug therapy: Secondary | ICD-10-CM | POA: Insufficient documentation

## 2014-12-26 DIAGNOSIS — F1020 Alcohol dependence, uncomplicated: Secondary | ICD-10-CM | POA: Diagnosis present

## 2014-12-26 DIAGNOSIS — Z7982 Long term (current) use of aspirin: Secondary | ICD-10-CM | POA: Insufficient documentation

## 2014-12-26 DIAGNOSIS — Z7902 Long term (current) use of antithrombotics/antiplatelets: Secondary | ICD-10-CM | POA: Insufficient documentation

## 2014-12-26 DIAGNOSIS — F10129 Alcohol abuse with intoxication, unspecified: Secondary | ICD-10-CM | POA: Diagnosis present

## 2014-12-26 HISTORY — DX: Alcohol abuse, uncomplicated: F10.10

## 2014-12-26 HISTORY — DX: Alcohol dependence, uncomplicated: F10.20

## 2014-12-26 LAB — CBC WITH DIFFERENTIAL/PLATELET
BASOS PCT: 1 %
Basophils Absolute: 0.1 10*3/uL (ref 0–0.1)
EOS PCT: 1 %
Eosinophils Absolute: 0.1 10*3/uL (ref 0–0.7)
HCT: 38 % — ABNORMAL LOW (ref 40.0–52.0)
Hemoglobin: 13.2 g/dL (ref 13.0–18.0)
Lymphocytes Relative: 32 %
Lymphs Abs: 2 10*3/uL (ref 1.0–3.6)
MCH: 32.9 pg (ref 26.0–34.0)
MCHC: 34.6 g/dL (ref 32.0–36.0)
MCV: 95.1 fL (ref 80.0–100.0)
Monocytes Absolute: 0.5 10*3/uL (ref 0.2–1.0)
Monocytes Relative: 8 %
Neutro Abs: 3.6 10*3/uL (ref 1.4–6.5)
Neutrophils Relative %: 58 %
PLATELETS: 237 10*3/uL (ref 150–440)
RBC: 4 MIL/uL — AB (ref 4.40–5.90)
RDW: 14.3 % (ref 11.5–14.5)
WBC: 6.2 10*3/uL (ref 3.8–10.6)

## 2014-12-26 LAB — LIPASE, BLOOD: Lipase: 46 U/L (ref 22–51)

## 2014-12-26 LAB — COMPREHENSIVE METABOLIC PANEL
ALT: 13 U/L — ABNORMAL LOW (ref 17–63)
AST: 28 U/L (ref 15–41)
Albumin: 3.2 g/dL — ABNORMAL LOW (ref 3.5–5.0)
Alkaline Phosphatase: 89 U/L (ref 38–126)
Anion gap: 11 (ref 5–15)
BUN: 10 mg/dL (ref 6–20)
CALCIUM: 8 mg/dL — AB (ref 8.9–10.3)
CO2: 25 mmol/L (ref 22–32)
Chloride: 107 mmol/L (ref 101–111)
Creatinine, Ser: 0.96 mg/dL (ref 0.61–1.24)
GFR calc Af Amer: >60 mL/min (ref >60)
GFR calc non Af Amer: >60 mL/min (ref >60)
Glucose, Bld: 86 mg/dL (ref 65–99)
Potassium: 3.3 mmol/L — ABNORMAL LOW (ref 3.5–5.1)
Sodium: 143 mmol/L (ref 135–145)
Total Bilirubin: 1.2 mg/dL (ref 0.3–1.2)
Total Protein: 5.7 g/dL — ABNORMAL LOW (ref 6.5–8.1)

## 2014-12-26 LAB — TROPONIN I: Troponin I: <0.03 ng/mL (ref <0.031)

## 2014-12-26 LAB — PROTIME-INR
INR: 1.06
Prothrombin Time: 14 seconds (ref 11.4–15.0)

## 2014-12-26 LAB — MAGNESIUM: MAGNESIUM: 1.9 mg/dL (ref 1.7–2.4)

## 2014-12-26 LAB — ETHANOL: Alcohol, Ethyl (B): 238 mg/dL — ABNORMAL HIGH (ref <5)

## 2014-12-26 MED ORDER — THIAMINE HCL 100 MG/ML IJ SOLN
Freq: Once | INTRAMUSCULAR | Status: AC
Start: 2014-12-26 — End: 2014-12-27
  Administered 2014-12-26: 23:00:00 via INTRAVENOUS
  Filled 2014-12-26: qty 1000

## 2014-12-26 NOTE — ED Provider Notes (Signed)
[facility] Emergency Department Provider Note  ____________________________________________  Time seen: Approximately 11:58 PM  I have reviewed the triage vital signs and the nursing notes.   HISTORY  Chief Complaint Near Syncope and Alcohol Intoxication    HPI [name] is a 64 y.o. male who presents to the ED from home via EMS for falls secondary to alcohol intoxication, binge drinking and suicidal thoughts. Patient is a daily drinker and has been binge drinking along with his wife for the past day. Last drink was over 4 hours ago. No prior history of DTs. Admits to increased stress recently which led to his increased drinking. Patient is depressed and having fleeting suicidal thoughts. Patient has a history of suicide attempt cutting his wrists approximately 10 years ago. Patient states he went out to the porch, returned to the house to take his metoprolol, stumbled and fell, striking his head. Denies LOC. Patient is tearful; denies other complaints. Recent hospitalization for chest pain   Past Medical History  Diagnosis Date  . Stroke   . COPD (chronic obstructive pulmonary disease)   . Hypertension   . Heart attack   . CAD (coronary artery disease)   . Bipolar affective disorder   . COPD (chronic obstructive pulmonary disease)   . Alcohol abuse     Patient Active Problem List   Diagnosis Date Noted  . Unstable angina 12/17/2014  . CAD (coronary artery disease) 12/17/2014  . Bipolar affective disorder 12/17/2014  . HTN (hypertension) 12/17/2014  . COPD (chronic obstructive pulmonary disease) 12/17/2014  . Alcohol abuse 12/17/2014    Past Surgical History  Procedure Laterality Date  . Coronary artery bypass graft    . Coronary stent placement    . Appendectomy    . Cholecystectomy    . Cardiac catheterization Left 12/18/2014    Procedure: Left Heart Cath and Coronary Angiography;  Surgeon: Laurier Nancy, MD;  Location: [facility] INVASIVE CV  LAB;  Service: Cardiovascular;  Laterality: Left;    Current Outpatient Rx  Name  Route  Sig  Dispense  Refill  . albuterol (PROVENTIL HFA;VENTOLIN HFA) 108 (90 BASE) MCG/ACT inhaler   Inhalation   Inhale 2 puffs into the lungs every 6 (six) hours as needed for wheezing or shortness of breath.          Marland Kitchen aspirin EC 81 MG tablet   Oral   Take 81 mg by mouth daily.         Marland Kitchen atorvastatin (LIPITOR) 80 MG tablet   Oral   Take 80 mg by mouth at bedtime.          . clopidogrel (PLAVIX) 75 MG tablet   Oral   Take 75 mg by mouth daily.         Marland Kitchen lisinopril (PRINIVIL,ZESTRIL) 20 MG tablet   Oral   Take 20 mg by mouth daily.         . metoprolol (LOPRESSOR) 100 MG tablet   Oral   Take 100 mg by mouth 2 (two) times daily.         . potassium chloride SA (K-DUR,KLOR-CON) 20 MEQ tablet   Oral   Take 20 mEq by mouth daily.         . chlordiazePOXIDE (LIBRIUM) 25 MG capsule   Oral   Take 25 mg by mouth 3 (three) times daily as needed for anxiety.           Allergies Review of patient's allergies indicates no known  allergies.  Family History  Problem Relation Age of Onset  . Heart attack Father   . Colon cancer Mother     Social History History  Substance Use Topics  . Smoking status: Current Every Day Smoker -- 0.50 packs/day for 33 years    Types: Cigarettes  . Smokeless tobacco: Not on file  . Alcohol Use: 0.0 oz/week    0 Standard drinks or equivalent per week    Review of Systems Constitutional: No fever/chills Eyes: No visual changes. ENT: No sore throat. Cardiovascular: Denies chest pain. Respiratory: Denies shortness of breath. Gastrointestinal: No abdominal pain.  No nausea, no vomiting.  No diarrhea.  No constipation. Genitourinary: Negative for dysuria. Musculoskeletal: Negative for back pain. Skin: Negative for rash. Neurological: Negative for headaches, focal weakness or numbness. Psychiatric:Depressed, SI  10-point ROS otherwise  negative.  ____________________________________________   PHYSICAL EXAM:  VITAL SIGNS: ED Triage Vitals  Enc Vitals Group     BP 12/26/14 2151 161/81 mmHg     Pulse Rate 12/26/14 2151 88     Resp 12/26/14 2151 14     Temp 12/26/14 2151 98.3 F (36.8 C)     Temp Source 12/26/14 2151 Oral     SpO2 12/26/14 2151 97 %     Weight 12/26/14 2151 195 lb (88.451 kg)     Height 12/26/14 2151 5\' 11"  (1.803 m)     Head Cir --      Peak Flow --      Pain Score 12/26/14 2212 3     Pain Loc --      Pain Edu? --      Excl. in GC? --     Constitutional: Alert and oriented. Tearful and in no acute distress. Eyes: Conjunctivae are bloodshot. PERRL. EOMI. Head: Atraumatic. Nose: No congestion/rhinnorhea. Mouth/Throat: Mucous membranes are moist.  Oropharynx non-erythematous. Neck: No stridor. No cervical spine tenderness to palpation. Cardiovascular: Normal rate, regular rhythm. Grossly normal heart sounds.  Good peripheral circulation. Respiratory: Normal respiratory effort.  No retractions. Lungs CTAB. Gastrointestinal: Soft and nontender. No distention. No abdominal bruits. No CVA tenderness. Musculoskeletal: No lower extremity tenderness nor edema.  No joint effusions. Neurologic:  Normal speech and language. No gross focal neurologic deficits are appreciated. Speech is normal.  Skin:  Skin is warm, dry and intact. No rash noted. Psychiatric: Mood and affect are flat. Speech and behavior are normal.  ____________________________________________   LABS (all labs ordered are listed, but only abnormal results are displayed)  Labs Reviewed  ETHANOL - Abnormal; Notable for the following:    Alcohol, Ethyl (B) 238 (*)    All other components within normal limits  COMPREHENSIVE METABOLIC PANEL - Abnormal; Notable for the following:    Potassium 3.3 (*)    Calcium 8.0 (*)    Total Protein 5.7 (*)    Albumin 3.2 (*)    ALT 13 (*)    All other components within normal limits  CBC WITH  DIFFERENTIAL/PLATELET - Abnormal; Notable for the following:    RBC 4.00 (*)    HCT 38.0 (*)    All other components within normal limits  LIPASE, BLOOD  MAGNESIUM  TROPONIN I  PROTIME-INR   ____________________________________________  EKG  ED ECG REPORT I, SUNG,JADE J, the attending physician, personally viewed and interpreted this ECG.   Date: 12/27/2014  EKG Time: 2204  Rate: 84  Rhythm: normal EKG, normal sinus rhythm  Axis: Normal  Intervals:none  ST&T Change: Nonspecific  ____________________________________________  RADIOLOGY  CT head without contrast interpreted per Dr. Clovis Riley: Negative for acute intracranial traumatic injury. There is moderate generalized atrophy, mild chronic microvascular changes and a few small chronic lacunar infarctions. ____________________________________________   PROCEDURES  Procedure(s) performed: None  Critical Care performed: No  ____________________________________________   INITIAL IMPRESSION / ASSESSMENT AND PLAN / ED COURSE  Pertinent labs & imaging results that were available during my care of the patient were reviewed by me and considered in my medical decision making (see chart for details).  64 year old male who presents after a drinking binge, tearful with suicidal ideations with a past history of suicide attempt. Will place patient on CIWA protocol, place patient under IVC for his safety, consult TTS and psychiatry to evaluate patient in the emergency department.  ----------------------------------------- 7:07 AM on 12/27/2014 -----------------------------------------  No events overnight. Patient resting in no acute distress. Pending psychiatry consult this morning. ____________________________________________   FINAL CLINICAL IMPRESSION(S) / ED DIAGNOSES  Final diagnoses:  Alcohol intoxication, uncomplicated  Bipolar affective disorder, currently depressed, mild      Irean Hong, MD 12/27/14  [phone]

## 2014-12-26 NOTE — ED Notes (Signed)
Patient arrived via EMS from home. Patient is a daily drinker and has been drinking non stop for the past day. Last drink was about 4.5 hours ago. States he was getting up to take his heart medication and started to fall and was able to catch himself before hitting the floor.  Patient states he did hit the left part of his forehead. Patient also admits to having fleeting suicidal thoughts in the past week. States he does not have a plan to harm himself or made any attempts recently but has had a thought about it.  He has a history of cutting his wrists about 10 years ago. Patient alert and oriented. States he did not lose consciousness when falling

## 2014-12-27 ENCOUNTER — Emergency Department: Payer: Medicare PPO

## 2014-12-27 DIAGNOSIS — F1020 Alcohol dependence, uncomplicated: Secondary | ICD-10-CM

## 2014-12-27 DIAGNOSIS — F1010 Alcohol abuse, uncomplicated: Secondary | ICD-10-CM

## 2014-12-27 HISTORY — DX: Alcohol dependence, uncomplicated: F10.20

## 2014-12-27 HISTORY — DX: Alcohol abuse, uncomplicated: F10.10

## 2014-12-27 MED ORDER — LISINOPRIL 20 MG PO TABS
ORAL_TABLET | ORAL | Status: AC
  Filled 2014-12-27: qty 1

## 2014-12-27 MED ORDER — ASPIRIN 81 MG PO CHEW
CHEWABLE_TABLET | ORAL | Status: AC
  Filled 2014-12-27: qty 1

## 2014-12-27 MED ORDER — LORAZEPAM 2 MG/ML IJ SOLN
INTRAMUSCULAR | Status: AC
  Filled 2014-12-27: qty 1

## 2014-12-27 MED ORDER — LORAZEPAM 2 MG/ML IJ SOLN: 1.0000 mg | Freq: Once | INTRAMUSCULAR | Status: DC

## 2014-12-27 MED ORDER — METOPROLOL TARTRATE 100 MG PO TABS
ORAL_TABLET | ORAL | Status: AC
Start: 2014-12-27 — End: 2014-12-27
  Administered 2014-12-27: 100 mg via ORAL
  Filled 2014-12-27: qty 1

## 2014-12-27 MED ORDER — ALBUTEROL SULFATE (2.5 MG/3ML) 0.083% IN NEBU: 3.0000 mL | INHALATION_SOLUTION | Freq: Four times a day (QID) | RESPIRATORY_TRACT | Status: DC | PRN

## 2014-12-27 MED ORDER — ASPIRIN 81 MG PO CHEW
81.0000 mg | CHEWABLE_TABLET | Freq: Every day | ORAL | Status: DC
  Administered 2014-12-27: 81 mg via ORAL

## 2014-12-27 MED ORDER — CLOPIDOGREL BISULFATE 75 MG PO TABS
75.0000 mg | ORAL_TABLET | Freq: Every day | ORAL | Status: DC
  Administered 2014-12-27: 75 mg via ORAL

## 2014-12-27 MED ORDER — LORAZEPAM 2 MG/ML IJ SOLN
0.0000 mg | Freq: Four times a day (QID) | INTRAMUSCULAR | Status: DC
  Administered 2014-12-27: 2 mg via INTRAVENOUS
  Administered 2014-12-27: 1 mg via INTRAVENOUS
  Administered 2014-12-27: 2 mg via INTRAVENOUS

## 2014-12-27 MED ORDER — LORAZEPAM 2 MG/ML IJ SOLN
INTRAMUSCULAR | Status: AC
Start: 2014-12-27 — End: 2014-12-27
  Administered 2014-12-27: 1 mg via INTRAVENOUS
  Filled 2014-12-27: qty 1

## 2014-12-27 MED ORDER — CLOPIDOGREL BISULFATE 75 MG PO TABS
ORAL_TABLET | ORAL | Status: AC
  Filled 2014-12-27: qty 1

## 2014-12-27 MED ORDER — POTASSIUM CHLORIDE CRYS ER 20 MEQ PO TBCR
20.0000 meq | EXTENDED_RELEASE_TABLET | Freq: Every day | ORAL | Status: DC
  Administered 2014-12-27: 20 meq via ORAL

## 2014-12-27 MED ORDER — METOPROLOL TARTRATE 100 MG PO TABS
100.0000 mg | ORAL_TABLET | Freq: Two times a day (BID) | ORAL | Status: DC
  Administered 2014-12-27: 100 mg via ORAL

## 2014-12-27 MED ORDER — POTASSIUM CHLORIDE CRYS ER 20 MEQ PO TBCR
EXTENDED_RELEASE_TABLET | ORAL | Status: AC
  Administered 2014-12-27: 20 meq via ORAL
  Filled 2014-12-27: qty 1

## 2014-12-27 MED ORDER — ATORVASTATIN CALCIUM 20 MG PO TABS: 80.0000 mg | ORAL_TABLET | Freq: Every day | ORAL | Status: DC

## 2014-12-27 MED ORDER — LISINOPRIL 20 MG PO TABS
20.0000 mg | ORAL_TABLET | Freq: Every day | ORAL | Status: DC
  Administered 2014-12-27: 20 mg via ORAL

## 2014-12-27 NOTE — BH Assessment (Signed)
Assessment Note  Russell GordonDavid E Singleton is an 64 y.o. male who presents to the ER via EMS due to his wife calling 911, because he passed out. Patient states he was at his house, coming from the bathroom and that's all he remembers. He states his called for help due to passing out and she was unable to wake him up.  Patient states, he has ongoing trouble with his heart. He has had triple by pass surgery and now on disability due to his heart condition.  He admits to drinking alcohol and believes it plays a role in him passing out. Patient states, the longest he's been sober is 20 years. Other times he was sober for approximately 5 and 3 years. The cause for the recent relapse is due to being at risk of being homeless.  According to the patient, the [employer] recently sold the home and the current owners are suing the patient for back rent.  Patient and his wife have to July 13th to move their belongings out but the house will be "padlocked" on 01/17/2015 and they will not be able to get their belongings.  Patient states, he doesn't have the money to move and his wife isn't working at this time. She is in the process of applying for disability, for PTSD and Bipolar.  Patient is currently a open client with [facility]. He is under the care of Dr. Sharlene DoryShamsher S. Ahluwalia. At one point, he was under the care of Dr. Elesa MassedWard but he longer works at the agency. Patient states, his current Dr. Stop him of his Klonopin and is self medicating to manage his moods and anxiety.  Patient reports of having one suicide attempt. It took place in [phone]. According to him, he "took all the stuff I needed and checked into a (hotel) room. But they found me. They pinged my phone." Patient cut himself on the back of his legs and on his arms. [facility]. He didn't require major medical attention. He was started on a antidepressant and reports of doing well at this time. He denies current SI but states he has vague  thoughts of dying. He have not intent or plans. He describes thoughts as, "Pasing thoughts. It's more like, Hm, thiers a bridge or oh, thiers a tree. But after that, theirs no more thought about it.   Axis I: Alcohol Abuse and Anxiety Disorder NOS Axis III:  Past Medical History  Diagnosis Date  . Stroke   . COPD (chronic obstructive pulmonary disease)   . Hypertension   . Heart attack   . CAD (coronary artery disease)   . Bipolar affective disorder   . COPD (chronic obstructive pulmonary disease)   . Alcohol abuse    Axis IV: economic problems, housing problems, occupational problems, other psychosocial or environmental problems, problems related to social environment, problems with access to health care services and problems with primary support group  Past Medical History:  Past Medical History  Diagnosis Date  . Stroke   . COPD (chronic obstructive pulmonary disease)   . Hypertension   . Heart attack   . CAD (coronary artery disease)   . Bipolar affective disorder   . COPD (chronic obstructive pulmonary disease)   . Alcohol abuse     Past Surgical History  Procedure Laterality Date  . Coronary artery bypass graft    . Coronary stent placement    . Appendectomy    . Cholecystectomy    .  Cardiac catheterization Left 12/18/2014    Procedure: Left Heart Cath and Coronary Angiography;  Surgeon: Laurier Nancy, MD;  Location: [facility] INVASIVE CV LAB;  Service: Cardiovascular;  Laterality: Left;    Family History:  Family History  Problem Relation Age of Onset  . Heart attack Father   . Colon cancer Mother     Social History:  reports that he has been smoking Cigarettes.  He has a 16.5 pack-year smoking history. He does not have any smokeless tobacco history on file. He reports that he drinks alcohol. He reports that he does not use illicit drugs.  Additional Social History:  Alcohol / Drug Use Pain Medications: None Reported Prescriptions: None Reported Over the  Counter: None Reported History of alcohol / drug use?: Yes Longest period of sobriety (when/how long): 5 years (5 years) Negative Consequences of Use: Financial, Personal relationships, Work / Programmer, multimedia Withdrawal Symptoms: Nausea / Vomiting, Fever / Chills, Sweats Substance #1 Name of Substance 1: Alcohol 1 - Age of First Use: 10 1 - Amount (size/oz): 1/2 pint to 1 pint 1 - Frequency: Two times a week 1 - Duration: Several months 1 - Last Use / Amount: 12/26/2014  CIWA: CIWA-Ar BP: (!) 192/100 mmHg Pulse Rate: (!) 103 Nausea and Vomiting: 3 Tactile Disturbances: none Tremor: two Auditory Disturbances: not present Paroxysmal Sweats: no sweat visible Visual Disturbances: not present Anxiety: two Headache, Fullness in Head: moderate Agitation: normal activity Orientation and Clouding of Sensorium: oriented and can do serial additions CIWA-Ar Total: 10 COWS:    Allergies: No Known Allergies  Home Medications:  (Not in a hospital admission)  OB/GYN Status:  No LMP for male patient.  General Assessment Data Location of Assessment: [facility] ED TTS Assessment: In system Is this a Tele or Face-to-Face Assessment?: Face-to-Face Is this an Initial Assessment or a Re-assessment for this encounter?: Initial Assessment Marital status: Married Leighton name: n/a Is patient pregnant?: No Pregnancy Status: No Living Arrangements: Spouse/significant other Can pt return to current living arrangement?: Yes Admission Status: Voluntary Is patient capable of signing voluntary admission?: Yes Referral Source: Self/Family/Friend Insurance type: [employer] Medicare  Medical Screening Exam [facility] Walk-in ONLY) Medical Exam completed: Yes  Crisis Care Plan Living Arrangements: Spouse/significant other Name of Psychiatrist: n/a Name of Therapist: n/a  Education Status Is patient currently in school?: No Current Grade: None Reported Highest grade of school patient has completed: 12th Name of  school: n/a Contact person: n/a  Risk to self with the past 6 months Suicidal Ideation: No-Not Currently/Within Last 6 Months Has patient been a risk to self within the past 6 months prior to admission? : Yes Suicidal Intent: No Has patient had any suicidal intent within the past 6 months prior to admission? : No Is patient at risk for suicide?: No Suicidal Plan?: No Has patient had any suicidal plan within the past 6 months prior to admission? : No Access to Means: No What has been your use of drugs/alcohol within the last 12 months?: Alcohol Previous Attempts/Gestures: Yes How many times?: 1 Other Self Harm Risks: One past attempt Triggers for Past Attempts:  (Active Addiction) Intentional Self Injurious Behavior: Cutting (In the past) Comment - Self Injurious Behavior: None currently Family Suicide History: Unknown Recent stressful life event(s): Loss (Comment), Financial Problems, Other (Comment) Persecutory voices/beliefs?: No Depression: Yes Depression Symptoms: Feeling worthless/self pity, Loss of interest in usual pleasures, Tearfulness, Fatigue, Guilt Substance abuse history and/or treatment for substance abuse?: Yes (Alcohol) Suicide prevention information given to non-admitted  patients: Not applicable  Risk to Others within the past 6 months Homicidal Ideation: No Does patient have any lifetime risk of violence toward others beyond the six months prior to admission? : No Thoughts of Harm to Others: No Current Homicidal Intent: No Current Homicidal Plan: No Access to Homicidal Means: No Identified Victim: None Reported History of harm to others?: No Assessment of Violence: None Noted Violent Behavior Description: None Reported Does patient have access to weapons?: No Criminal Charges Pending?: No Does patient have a court date: No Is patient on probation?: No  Psychosis Hallucinations: None noted Delusions: None noted  Mental Status Report Appearance/Hygiene:  Unremarkable, In scrubs, In hospital gown Eye Contact: Poor Motor Activity: Freedom of movement, Unremarkable Speech: Logical/coherent, Soft Level of Consciousness: Alert Mood: Depressed, Anxious, Helpless, Guilty, Sad, Pleasant Affect: Anxious, Appropriate to circumstance Anxiety Level: Minimal Thought Processes: Coherent, Relevant Judgement: Unimpaired Orientation: Person, Place, Time, Situation, Appropriate for developmental age Obsessive Compulsive Thoughts/Behaviors: Minimal  Cognitive Functioning Concentration: Normal Memory: Recent Intact, Remote Intact IQ: Average Insight: Poor Impulse Control: Poor Appetite: Fair Weight Loss: 0 Weight Gain: 0 Sleep: Decreased Total Hours of Sleep: 5 Vegetative Symptoms: None  ADLScreening [facility] Assessment Services) Patient's cognitive ability adequate to safely complete daily activities?: Yes Patient able to express need for assistance with ADLs?: Yes Independently performs ADLs?: Yes (appropriate for developmental age)  Prior Inpatient Therapy Prior Inpatient Therapy: Yes Prior Therapy Dates: 2011 Prior Therapy Facilty[facility]Provider[facility] Reason for Treatment: Depression and Suicide attempt.  Prior Outpatient Therapy Prior Outpatient Therapy: Yes Prior Therapy Dates: 11/2014 Prior Therapy Facilty/Provider(s): [facility] Reason for Treatment: Depression and Anxiety Does patient have an ACCT team?: No Does patient have Intensive In-House Services?  : No Does patient have [facility] services? : No Does patient have P4CC services?: No  ADL Screening (condition at time of admission) Patient's cognitive ability adequate to safely complete daily activities?: Yes Patient able to express need for assistance with ADLs?: Yes Independently performs ADLs?: Yes (appropriate for developmental age)       Abuse/Neglect Assessment (Assessment to be complete while patient is alone) Physical Abuse: Denies Verbal Abuse:  Denies Sexual Abuse: Denies Exploitation of patient/patient's resources: Denies Self-Neglect: Denies Values / Beliefs Cultural Requests During Hospitalization: None Spiritual Requests During Hospitalization: None Consults Spiritual Care Consult Needed: No Social Work Consult Needed: No Merchant navy officer (For Healthcare) Does patient have an advance directive?: No Would patient like information on creating an advanced directive?: No - patient declined information    Additional Information 1:1 In Past 12 Months?: No CIRT Risk: No Elopement Risk: No Does patient have medical clearance?: Yes  Child/Adolescent Assessment Running Away Risk: Denies (Patient is an Adult)  Disposition:  Disposition Initial Assessment Completed for this Encounter: Yes Disposition of Patient: Other dispositions (Consult for Psych MD to see patient.) Other disposition(s): Other (Comment) (Consult for Psych MD to see patient.)  On Site Evaluation by:   Reviewed with Physician:    Morley Kos 12/27/2014 12:32 PM

## 2014-12-27 NOTE — ED Provider Notes (Signed)
Patient cleared for discharge by psychiatry  Jene Everyobert Samaria Anes, MD 12/27/14 1435

## 2014-12-27 NOTE — ED Notes (Signed)
BEHAVIORAL HEALTH ROUNDING Patient sleeping: Yes.   Patient alert and oriented: yes Behavior appropriate: Yes.  ; If no, describe:  Nutrition and fluids offered: Yes  Toileting and hygiene offered: Yes  Sitter present: no Law enforcement present: Yes  

## 2014-12-27 NOTE — ED Notes (Signed)
BEHAVIORAL HEALTH ROUNDING Patient sleeping: No. Patient alert and oriented: yes Behavior appropriate: Yes.  ; If no, describe:  Nutrition and fluids offered: Yes  Toileting and hygiene offered: Yes  Sitter present: no Law enforcement present: Yes  

## 2014-12-27 NOTE — ED Notes (Signed)

## 2014-12-27 NOTE — ED Notes (Signed)
BEHAVIORAL HEALTH ROUNDING Patient sleeping: Yes.   Patient alert and oriented: yes Behavior appropriate: Yes.  ; If no, describe:  Nutrition and fluids offered: Yes  Toileting and hygiene offered: Yes  Sitter present: yes Law enforcement present: Yes, ODS Engineer, materialssecurity officer

## 2014-12-27 NOTE — Consult Note (Addendum)
[facility] Psychiatry Consult   Reason for Consult:  SI Referring Physician:  [facility] ED Physicians  Patient Identification: [name] MRN:  [id number] Principal Diagnosis: Alcohol abuse, continuous drinking behavior   Diagnosis:   Patient Active Problem List   Diagnosis Date Noted  . Alcohol abuse, continuous drinking behavior [F10.10] 12/27/2014  . Unstable angina [I20.0] 12/17/2014  . CAD (coronary artery disease) [I25.10] 12/17/2014  . Bipolar affective disorder [F31.9] 12/17/2014  . HTN (hypertension) [I10] 12/17/2014  . COPD (chronic obstructive pulmonary disease) [J44.9] 12/17/2014  . Alcohol abuse [F10.10] 12/17/2014    Total Time spent with patient: 1 hour  Subjective:   [name] is a 64 y.o. male patient admitted with alcohol abuse who was admitted yesterday after his wife (who was intoxicated as well) began berating him and stating and causing more stress in the home. While she was intoxicated, the pt shared his wife threatened to kill herself and then he began to feel suicidal. This resolved after arrival to the hospital and metabolizing the ethanol in his system. The pt was intoxicated at this point as well.    The pt shared he started consuming 4 Mike's Hard Lemonades per day about 4 months ago. He then added a pint of whiskey shared with his wife over the course of the week about 2 months ago.  [name] states he craves alcohol, experience DTs in the 1980s but did not have seizures. Alcohol is affecting his daily life as he has lost several jobs because of it in the past. He denies looking for alcohol when he does not have it.    He notes his recent stressors to alcohol use include needing a find a place to live in about 13 days because he and his wife will likely be evicted. The pt is hopeful his wife's disability will come through prior to that. There are increased financial stressors and the pt notes he has not attended AA in 6 months.    He also shared  alcohol helps to decrease neck pain as he is unable to obtain pain medications.  He is also in need of eye surgery and is currently seeing spots in front of his eyes. He denies visual, auditory, gustatory or tactile hallucinations.  PT denies depression, OCD, psychosis and mania.   The pt shared he has anxiety and feels restless, keyed up, on edge, and has muscle tightness. He feels he could be safe at home. His last drink was 4:30pm yesterday. The pt wants to try to return to [place]. He denies there are weapons in the home.    He had a psychiatrist in the past but last saw him 3-4 months ago. He does not currently have a psychiatrist. Pt denies SI, HI and AVH.  HPI:  See above HPI Elements:   Location:  see above. Quality:  moderate. Severity:  moderate. Timing:  4 months. Duration:  4 months. Context:  increased home stress.  Past Medical History:  Past Medical History  Diagnosis Date  . Stroke   . COPD (chronic obstructive pulmonary disease)   . Hypertension   . Heart attack   . CAD (coronary artery disease)   . Bipolar affective disorder   . COPD (chronic obstructive pulmonary disease)   . Alcohol abuse     Past Surgical History  Procedure Laterality Date  . Coronary artery bypass graft    . Coronary stent placement    . Appendectomy    . Cholecystectomy    .  Cardiac catheterization Left 12/18/2014    Procedure: Left Heart Cath and Coronary Angiography;  Surgeon: Dionisio Manson, MD;  Location: [place] CV LAB;  Service: Cardiovascular;  Laterality: Left;   Family History:  Family History  Problem Relation Age of Onset  . Heart attack Father   . Colon cancer Mother    Social History:  History  Alcohol Use  . 0.0 oz/week  . 0 Standard drinks or equivalent per week     History  Drug Use No    History   Social History  . Marital Status: Single    Spouse Name: N/A  . Number of Children: N/A  . Years of Education: N/A   Social History Main Topics  . Smoking  status: Current Every Day Smoker -- 0.50 packs/day for 33 years    Types: Cigarettes  . Smokeless tobacco: Not on file  . Alcohol Use: 0.0 oz/week    0 Standard drinks or equivalent per week  . Drug Use: No  . Sexual Activity: Not on file   Other Topics Concern  . None   Social History Narrative   Additional Social History:                          Allergies:  No Known Allergies  Labs:  Results for orders placed or performed during the hospital encounter of 12/26/14 (from the past 48 hour(s))  Ethanol     Status: Abnormal   Collection Time: 12/26/14 10:29 PM  Result Value Ref Range   Alcohol, Ethyl (B) 238 (H) <5 mg/dL    Comment:        LOWEST DETECTABLE LIMIT FOR SERUM ALCOHOL IS 5 mg/dL FOR MEDICAL PURPOSES ONLY   Comprehensive metabolic panel     Status: Abnormal   Collection Time: 12/26/14 10:29 PM  Result Value Ref Range   Sodium 143 135 - 145 mmol/L   Potassium 3.3 (L) 3.5 - 5.1 mmol/L   Chloride 107 101 - 111 mmol/L   CO2 25 22 - 32 mmol/L   Glucose, Bld 86 65 - 99 mg/dL   BUN 10 6 - 20 mg/dL   Creatinine, Ser 0.96 0.61 - 1.24 mg/dL   Calcium 8.0 (L) 8.9 - 10.3 mg/dL   Total Protein 5.7 (L) 6.5 - 8.1 g/dL   Albumin 3.2 (L) 3.5 - 5.0 g/dL   AST 28 15 - 41 U/L   ALT 13 (L) 17 - 63 U/L   Alkaline Phosphatase 89 38 - 126 U/L   Total Bilirubin 1.2 0.3 - 1.2 mg/dL   GFR calc non Af Amer >60 >60 mL/min   GFR calc Af Amer >60 >60 mL/min    Comment: (NOTE) The eGFR has been calculated using the CKD EPI equation. This calculation has not been validated in all clinical situations. eGFR's persistently <60 mL/min signify possible Chronic Kidney Disease.    Anion gap 11 5 - 15  Lipase, blood     Status: None   Collection Time: 12/26/14 10:29 PM  Result Value Ref Range   Lipase 46 22 - 51 U/L  CBC with Differential/Platelet     Status: Abnormal   Collection Time: 12/26/14 10:29 PM  Result Value Ref Range   WBC 6.2 3.8 - 10.6 K/uL   RBC 4.00 (L) 4.40 -  5.90 MIL/uL   Hemoglobin 13.2 13.0 - 18.0 g/dL   HCT 38.0 (L) 40.0 - 52.0 %   MCV 95.1 80.0 -  100.0 fL   MCH 32.9 26.0 - 34.0 pg   MCHC 34.6 32.0 - 36.0 g/dL   RDW 14.3 11.5 - 14.5 %   Platelets 237 150 - 440 K/uL   Neutrophils Relative % 58 %   Neutro Abs 3.6 1.4 - 6.5 K/uL   Lymphocytes Relative 32 %   Lymphs Abs 2.0 1.0 - 3.6 K/uL   Monocytes Relative 8 %   Monocytes Absolute 0.5 0.2 - 1.0 K/uL   Eosinophils Relative 1 %   Eosinophils Absolute 0.1 0 - 0.7 K/uL   Basophils Relative 1 %   Basophils Absolute 0.1 0 - 0.1 K/uL  Magnesium     Status: None   Collection Time: 12/26/14 10:29 PM  Result Value Ref Range   Magnesium 1.9 1.7 - 2.4 mg/dL  Troponin I     Status: None   Collection Time: 12/26/14 10:29 PM  Result Value Ref Range   Troponin I <0.03 <0.031 ng/mL    Comment:        NO INDICATION OF MYOCARDIAL INJURY.   Protime-INR     Status: None   Collection Time: 12/26/14 11:03 PM  Result Value Ref Range   Prothrombin Time 14.0 11.4 - 15.0 seconds   INR 1.06     Vitals: Blood pressure 192/100, pulse 103, temperature 97 F (36.1 C), temperature source Oral, resp. rate 18, height 5' 11"  (1.803 m), weight 88.451 kg (195 lb), SpO2 96 %.  Risk to Self: Is patient at risk for suicide?: Yes Risk to Others:   Prior Inpatient Therapy:   Prior Outpatient Therapy:    Current Facility-Administered Medications  Medication Dose Route Frequency Provider Last Rate Last Dose  . albuterol (PROVENTIL) (2.5 MG/3ML) 0.083% nebulizer solution 3 mL  3 mL Inhalation Q6H PRN Paulette Blanch, MD      . aspirin chewable tablet 81 mg  81 mg Oral Daily Paulette Blanch, MD      . atorvastatin (LIPITOR) tablet 80 mg  80 mg Oral q1800 Paulette Blanch, MD      . clopidogrel (PLAVIX) tablet 75 mg  75 mg Oral Daily Paulette Blanch, MD      . lisinopril (PRINIVIL,ZESTRIL) tablet 20 mg  20 mg Oral Daily Paulette Blanch, MD      . LORazepam (ATIVAN) injection 0-4 mg  0-4 mg Intravenous 4 times per day Paulette Blanch, MD    2 mg at 12/27/14 0636  . metoprolol (LOPRESSOR) tablet 100 mg  100 mg Oral BID Paulette Blanch, MD   100 mg at 12/27/14 0118  . potassium chloride SA (K-DUR,KLOR-CON) CR tablet 20 mEq  20 mEq Oral Daily Paulette Blanch, MD       Current Outpatient Prescriptions  Medication Sig Dispense Refill  . albuterol (PROVENTIL HFA;VENTOLIN HFA) 108 (90 BASE) MCG/ACT inhaler Inhale 2 puffs into the lungs every 6 (six) hours as needed for wheezing or shortness of breath.     Marland Kitchen aspirin EC 81 MG tablet Take 81 mg by mouth daily.    Marland Kitchen atorvastatin (LIPITOR) 80 MG tablet Take 80 mg by mouth at bedtime.     . clopidogrel (PLAVIX) 75 MG tablet Take 75 mg by mouth daily.    Marland Kitchen lisinopril (PRINIVIL,ZESTRIL) 20 MG tablet Take 20 mg by mouth daily.    . metoprolol (LOPRESSOR) 100 MG tablet Take 100 mg by mouth 2 (two) times daily.    . potassium chloride SA (K-DUR,KLOR-CON) 20 MEQ tablet Take  20 mEq by mouth daily.    . chlordiazePOXIDE (LIBRIUM) 25 MG capsule Take 25 mg by mouth 3 (three) times daily as needed for anxiety.      Musculoskeletal: Strength & Muscle Tone: within normal limits Gait & Station: not assessed Patient leans: Right  Psychiatric Specialty Exam: Physical Exam  Review of Systems  Constitutional: Negative.   HENT: Negative.   Eyes: Negative.   Respiratory: Negative.   Cardiovascular: Negative.   Gastrointestinal: Negative.   Genitourinary: Negative.   Musculoskeletal: Negative.   Skin: Negative.   Neurological: Negative.   Endo/Heme/Allergies: Negative.   Psychiatric/Behavioral: Positive for substance abuse. Negative for depression, suicidal ideas, hallucinations and memory loss. The patient is nervous/anxious and has insomnia.     Blood pressure 192/100, pulse 103, temperature 97 F (36.1 C), temperature source Oral, resp. rate 18, height 5' 11"  (1.803 m), weight 88.451 kg (195 lb), SpO2 96 %.Body mass index is 27.21 kg/(m^2).  General Appearance: Casual and Fairly Groomed  Eye Contact::   Good  Speech:  Clear and Coherent and Normal Rate  Volume:  Normal  Mood:  Euthymic  Affect:  Appropriate  Thought Process:  Coherent, Goal Directed, Intact, Linear and Logical  Orientation:  Full (Time, Place, and Person)  Thought Content:  Negative  Suicidal Thoughts:  No  Homicidal Thoughts:  No  Memory:  Immediate;   Good Recent;   Good Remote;   Good  Judgement:  Fair  Insight:  Fair  Psychomotor Activity:  Normal  Concentration:  Good  Recall:  Good  Fund of Knowledge:Good  Language: Good  Akathisia:  NA  Handed:  Right  AIMS (if indicated):     Assets:  Communication Skills Desire for Improvement Intimacy Resilience Social Support Herbalist Vocational/Educational  ADL's:  Intact  Cognition: WNL  Sleep:      Medical Decision Making: Established Problem, Stable/Improving (1), Review of Psycho-Social Stressors (1), Review or order clinical lab tests (1), Review or order medicine tests (1) and Review of Medication Regimen & Side Effects (2)  Treatment Plan Summary: Plan discharge home with psychiatric follow-up  Plan:  No evidence of imminent risk to self or others at present.   Supportive therapy provided about ongoing stressors. Discussed crisis plan, support from social network, calling 911, coming to the Emergency Department, and calling Suicide Hotline.  Pt provided resources for psychiatric follow-up within the community  Disposition: Home  Donita Brooks, M.D. Attending Physician [place] 12/27/2014 9:44 AM

## 2014-12-27 NOTE — ED Notes (Signed)
BEHAVIORAL HEALTH ROUNDING Patient sleeping: No. Patient alert and oriented: yes Behavior appropriate: Yes.  ; If no, describe:  Nutrition and fluids offered: Yes  Toileting and hygiene offered: Yes  Sitter present: yes Law enforcement present: Yes, ODS Engineer, materialssecurity officer

## 2014-12-27 NOTE — Progress Notes (Signed)
Patient evaluated by Psych MD with recommendation to discontinue IVC, discharge home with outpatient services to follow up with [facility].  CSW provided patient with outpatient resources for [facility] and [facility].   Sammuel Hineseborah Moore. Theresia MajorsLCSWA, MSW Clinical Social Work Department Emergency Room [phone] 3:16 PM

## 2014-12-27 NOTE — Discharge Instructions (Signed)
Alcohol Intoxication °Alcohol intoxication occurs when you drink enough alcohol that it affects your ability to function. It can be mild or very severe. Drinking a lot of alcohol in a short time is called binge drinking. This can be very harmful. Drinking alcohol can also be more dangerous if you are taking medicines or other drugs. Some of the effects caused by alcohol may include: °· Loss of coordination. °· Changes in mood and behavior. °· Unclear thinking. °· Trouble talking (slurred speech). °· Throwing up (vomiting). °· Confusion. °· Slowed breathing. °· Twitching and shaking (seizures). °· Loss of consciousness. °HOME CARE °· Do not drive after drinking alcohol. °· Drink enough water and fluids to keep your pee (urine) clear or pale yellow. Avoid caffeine. °· Only take medicine as told by your doctor. °GET HELP IF: °· You throw up (vomit) many times. °· You do not feel better after a few days. °· You frequently have alcohol intoxication. Your doctor can help decide if you should see a substance use treatment counselor. °GET HELP RIGHT AWAY IF: °· You become shaky when you stop drinking. °· You have twitching and shaking. °· You throw up blood. It may look bright red or like coffee grounds. °· You notice blood in your poop (bowel movements). °· You become lightheaded or pass out (faint). °MAKE SURE YOU:  °· Understand these instructions. °· Will watch your condition. °· Will get help right away if you are not doing well or get worse. °Document Released: 11/30/2007 Document Revised: 02/13/2013 Document Reviewed: 11/16/2012 °ExitCare® Patient Information ©2015 [employer]. This information is not intended to replace advice given to you by your health care provider. Make sure you discuss any questions you have with your health care provider. ° °

## 2014-12-27 NOTE — ED Notes (Signed)
BEHAVIORAL HEALTH ROUNDING Patient sleeping: Yes.   Patient alert and oriented: not applicable Behavior appropriate: Yes.  ; If no, describe:  Nutrition and fluids offered: No Toileting and hygiene offered: No Sitter present: no Law enforcement present: No  ED tech performing rounding, law enforcement available down the hall.

## 2014-12-27 NOTE — ED Notes (Signed)
ENVIRONMENTAL ASSESSMENT Potentially harmful objects out of patient reach: Yes.   Personal belongings secured: No. Patient dressed in hospital provided attire only: No. Plastic bags out of patient reach: Yes.   Patient care equipment (cords, cables, call bells, lines, and drains) shortened, removed, or accounted for: Yes.   Equipment and supplies removed from bottom of stretcher: No. Potentially toxic materials out of patient reach: Yes.   Sharps container removed or out of patient reach: Yes.     Pt in rm 19.  Per Dr. Dolores FrameSung, okay to hold pt in regular room until space available in quad.

## 2014-12-27 NOTE — ED Notes (Signed)
ED BHU PLACEMENT JUSTIFICATION Is the patient under IVC or is there intent for IVC: Yes.   Is the patient medically cleared: Yes.   Is there vacancy in the ED BHU: Yes.   Is the population mix appropriate for patient: No. Is the patient awaiting placement in inpatient or outpatient setting: No. Has the patient had a psychiatric consult: No. Survey of unit performed for contraband, proper placement and condition of furniture, tampering with fixtures in bathroom, shower, and each patient room: Yes.  ; Findings:  APPEARANCE/BEHAVIOR calm, cooperative and adequate rapport can be established NEURO ASSESSMENT Orientation: time, place and person Hallucinations: No.None noted (Hallucinations) Speech: Normal Gait: normal RESPIRATORY ASSESSMENT Normal expansion.  Clear to auscultation.  No rales, rhonchi, or wheezing., No chest wall tenderness. CARDIOVASCULAR ASSESSMENT regular rate and rhythm, S1, S2 normal, no murmur, click, rub or gallop GASTROINTESTINAL ASSESSMENT soft, nontender, BS WNL, no r/g EXTREMITIES normal strength, tone, and muscle mass, no deformities, no erythema, induration, or nodules PLAN OF CARE Provide calm/safe environment. Vital signs assessed twice daily. ED BHU Assessment once each 12-hour shift. Collaborate with intake RN daily or as condition indicates. Assure the ED provider has rounded once each shift. Provide and encourage hygiene. Provide redirection as needed. Assess for escalating behavior; address immediately and inform ED provider.  Assess family dynamic and appropriateness for visitation as needed: Yes.  ; If necessary, describe findings:  Educate the patient/family about BHU procedures/visitation: Yes.  ; If necessary, describe findings:

## 2014-12-28 NOTE — Discharge Summary (Signed)
[facility] - [place] [facility]  DISCHARGE SUMMARY   PATIENT NAME: [name]    MR#:  [id number]  DATE OF BIRTH:  09-20-1950  DATE OF ADMISSION:  12/17/2014 ADMITTING PHYSICIAN: Russell Manis, MD  DATE OF DISCHARGE: 12/18/2014  7:59 PM  PRIMARY CARE PHYSICIAN: Russell Nancy, MD    ADMISSION DIAGNOSIS:  Chest pain, unspecified chest pain type [R07.9]  DISCHARGE DIAGNOSIS:  Principal Problem:   Unstable angina Active Problems:   CAD (coronary artery disease)   HTN (hypertension)   COPD (chronic obstructive pulmonary disease)   SECONDARY DIAGNOSIS:   Past Medical History  Diagnosis Date  . Stroke   . COPD (chronic obstructive pulmonary disease)   . Hypertension   . Heart attack   . CAD (coronary artery disease)   . Bipolar affective disorder   . COPD (chronic obstructive pulmonary disease)   . Alcohol abuse   . Alcohol abuse, continuous drinking behavior 12/27/2014  . Alcohol use disorder, severe, dependence 12/27/2014    HOSPITAL COURSE:   This patient left AMA. I did not discharge the patient. I did not have the opportunity to change/refill medications or provide discharge instructions to this patient.  1 unstable angina: Troponins negative, no EKG changes or events on telemetry. Cardiac catheterization by Dr. Welton Flakes on 6/23 with Mid LAD , Mid LCX , proximal RCA occluded, but LIMA to LAD, SVG to LCX and SVG to dital RCA patent with normal EF. Blood pressure during cath was over 200. Needed to control pressure and plan discharge am.  #2 alcohol withdrawal: Hypertensive and very talkative throughout admission. No tachycardia, diaphoresis, tremor. Covered with CIWA protocol.  #3 hypertension: Currently uncontrolled but I think this is due to alcohol withdrawal. See discussion above. Continue with treatment of alcohol withdrawal, continue with metoprolol, lisinopril, when necessary hydralazine.  #4 COPD: No exacerbation.. Continue with Spiriva,  DULERA, albuterol  DISCHARGE CONDITIONS:   Left AMA. I did not have a chance to evaluation patient prior to discharge.  CONSULTS OBTAINED:  Treatment Team:  Russell Nancy, MD  DRUG ALLERGIES:  No Known Allergies  DISCHARGE MEDICATIONS:   Discharge Medication List as of 12/18/2014  7:59 PM    CONTINUE these medications which have NOT CHANGED   Details  albuterol (PROVENTIL HFA;VENTOLIN HFA) 108 (90 BASE) MCG/ACT inhaler Inhale 2 puffs into the lungs every 6 (six) hours as needed for wheezing or shortness of breath. , Until Discontinued, Historical Med    aspirin EC 81 MG tablet Take 81 mg by mouth daily., Until Discontinued, Historical Med    atorvastatin (LIPITOR) 80 MG tablet Take 80 mg by mouth at bedtime. , Until Discontinued, Historical Med    chlordiazePOXIDE (LIBRIUM) 25 MG capsule Take 25 mg by mouth 3 (three) times daily as needed for anxiety., Until Discontinued, Historical Med    clopidogrel (PLAVIX) 75 MG tablet Take 75 mg by mouth daily., Until Discontinued, Historical Med    lisinopril (PRINIVIL,ZESTRIL) 20 MG tablet Take 20 mg by mouth daily., Until Discontinued, Historical Med    metoprolol (LOPRESSOR) 100 MG tablet Take 100 mg by mouth 2 (two) times daily., Until Discontinued, Historical Med    potassium chloride SA (K-DUR,KLOR-CON) 20 MEQ tablet Take 20 mEq by mouth daily., Until Discontinued, Historical Med         DISCHARGE INSTRUCTIONS:   None given. AMA discharge  Today   CHIEF COMPLAINT:   Chief Complaint  Patient presents with  . Chest Pain  HISTORY OF PRESENT ILLNESS:  [name] is a 64 y.o. male who presents with chest pain. This patient has a known history of coronary artery disease status post CABG and prior stenting. Of note most recently he had some chest pain in February this year, had a negative stress test but then required stenting one month later, denoting significantly high risk of cardiac cause for this patient's chest  pain. In the ED his first troponin was negative, but his EKG showed some lateral nonspecific ST changes. Patient states that his chest pain was sharp, left-sided with radiation to his left arm, not associated with nausea, vomiting, blurred vision, headache, diaphoresis. His chest pain has resolved by the time of this interview. Hospitalists were called for admission for unstable angina. Of note the patient also has been drinking significantly in the last several days, but has not had anything to drink for the last 10 hours. His alcohol level was in the 200s.  VITAL SIGNS:  Blood pressure 160/99, pulse 99, temperature 98.2 F (36.8 C), temperature source Oral, resp. rate 20, height 5\' 11"  (1.803 m), weight 90.81 kg (200 lb 3.2 oz), SpO2 99 %.  I/O:  No intake or output data in the 24 hours ending 12/28/14 1253  PHYSICAL EXAMINATION:   Unable to examine. See examination from earlier in the day in my prior note.  DATA REVIEW:   CBC  Recent Labs Lab 12/26/14 2229  WBC 6.2  HGB 13.2  HCT 38.0*  PLT 237    Chemistries   Recent Labs Lab 12/26/14 2229  NA 143  K 3.3*  CL 107  CO2 25  GLUCOSE 86  BUN 10  CREATININE 0.96  CALCIUM 8.0*  MG 1.9  AST 28  ALT 13*  ALKPHOS 89  BILITOT 1.2    Cardiac Enzymes  Recent Labs Lab 12/26/14 2229  TROPONINI <0.03    Microbiology Results  No results found for this or any previous visit.  RADIOLOGY:  Ct Head Wo Contrast  12/27/2014   CLINICAL DATA:  Larey SeatFell today, striking left forehead.  EXAM: CT HEAD WITHOUT CONTRAST  TECHNIQUE: Contiguous axial images were obtained from the base of the skull through the vertex without intravenous contrast.  COMPARISON:  None.  FINDINGS: There is mild left periorbital and supraorbital soft tissue swelling.  There is no intracranial hemorrhage or extra-axial fluid collection. There is moderate generalized atrophy. There is white matter hypodensity consistent with chronic microvascular disease. A few  chronic lacunar infarctions are suggested in the left thalamus and right lentiform nucleus. No acute intracranial findings are evident.  Skull base and calvarium are intact.  IMPRESSION: Negative for acute intracranial traumatic injury. There is moderate generalized atrophy, mild chronic microvascular changes and a few small chronic lacunar infarctions.   Electronically Signed   By: Ellery Plunkaniel R Mitchell M.D.   On: 12/27/2014 00:45    EKG:   Orders placed or performed during the hospital encounter of 12/17/14  . ED EKG  . ED EKG  . EKG      Management plans discussed with the patient, family and they are in agreement.  CODE STATUS: Full  TOTAL TIME TAKING CARE OF THIS PATIENT: 15 minutes.    Elby ShowersWALSH, Alif Petrak M.D on 12/28/2014 at 12:53 PM  Between 7am to 6pm - Pager - [phone]  After 6pm go to [email or web address] - password [email or web address] [email or web address]  [facility]  Office  [phone]  CC: Primary care physician; Russell NancyKHAN,SHAUKAT A, MD

## 2015-06-23 ENCOUNTER — Other Ambulatory Visit: Payer: Self-pay

## 2015-06-23 NOTE — Patient Outreach (Signed)
[facility] Care Management  06/23/2015  Russell GordonDavid E Kanitz May 17, 1951 [id number]  Unsuccessful attempt to reach [facility] referred due to high utilization of ED. Unable to leave a message due to "full voice mailbox".  RN Health Coach will make another attempt within 5 working days.  [name] [name], RN, MSN RN [employer] [employer] [facility] [phone] Fax [phone]

## 2015-06-24 ENCOUNTER — Other Ambulatory Visit: Payer: Self-pay

## 2015-06-24 NOTE — Patient Outreach (Signed)
[facility]) Care Management  06/24/2015  Russell GordonDavid E Lipsey 11-11-1950 [id number]   2nd unsuccessful attempt to reach patient by phone. Left a call back number, but was unable to leave a message due to a full voice mailbox.  RN Health Coach will attempt another contact within one week.  [name] Sabin Gibeault, RN, MSN RN [employer] [phone] Fax [phone]

## 2015-06-30 ENCOUNTER — Other Ambulatory Visit: Payer: Self-pay

## 2015-06-30 NOTE — Patient Outreach (Signed)
[facility]) Care Management  06/30/2015  Farrel GordonDavid E Paszkiewicz 06-08-1951 [id number]   Third unsuccessful attempt to reach patient referred for high ED utilization.  Unable to leave a message due to 'full mailbox'.  Plan:  Send letter with pamphlet and contact information asking him to call.  [name] Wendell Fiebig, RN, MSN RN [employer] [facility] [phone] Fax [phone]

## 2015-09-29 ENCOUNTER — Other Ambulatory Visit: Payer: Self-pay

## 2015-09-29 NOTE — Patient Outreach (Signed)
[facility] Care Management  09/29/2015  Farrel GordonDavid E Schiller 1951/05/29 [id number]   Unsuccessful attempt to reach patient referred [facility] 09-23-15 [facility].  Attempted to reach patient at his home number, and also at a number provided by his PCP office.  Left a call back number on the home phone, as his voice mail box was full.  RN will make another attempt to reach patient within 5 working days.  [name] Larie Mathes, RN, MSN RN [employer] [facility] [phone] Fax [phone]

## 2015-10-01 ENCOUNTER — Other Ambulatory Visit: Payer: Self-pay

## 2015-10-01 NOTE — Patient Outreach (Signed)
[facility]) Care Management  10/01/2015  Farrel GordonDavid E Grismore 06-26-1951 [id number]   Second unsuccessful attempt to reach patient for screening.  Tried both numbers [phone], and [phone] .  Unable to leave voice mail message, but did leave call back number.  If no response RN will make another attempt to reach patient by 10-13-15.  [name] Micca Matura, RN, MSN RN [employer] [facility] [phone] Fax [phone]

## 2015-10-06 ENCOUNTER — Other Ambulatory Visit: Payer: Self-pay

## 2015-10-06 NOTE — Patient Outreach (Signed)
[facility] Care Management  10/06/2015  Farrel GordonDavid E Knape 1951/02/15 [id number]    3rd Telephone call to patient for [facility].  Called [phone]-left call back number.  Called [phone]-fast busy signal received.   Plan: RN Health Coach will send letter to attempt outreach.  If no answer within 10 business days will proceed to close case.    Bary Lericheionne J Cody Oliger, RN, MSN [facility] Care [facility] RN Telephonic Health Coach [phone]

## 2015-10-13 ENCOUNTER — Ambulatory Visit: Payer: Self-pay

## 2015-10-20 ENCOUNTER — Other Ambulatory Visit: Payer: Self-pay

## 2015-10-20 NOTE — Patient Outreach (Signed)
[facility] Care Management  10/20/2015  Russell GordonDavid E Strebeck December 18, 1950 [id number]   No response from patient after 3 outreach calls and letter.  Plan: RN Health Coach will forward patient information to Care Management Assistant for case closure.   RN Health Coach will send notification to physician.    [name] Russell Garek Schuneman, RN, MSN [facility] Care [facility] RN Telephonic Health Coach [phone]

## 2016-07-24 IMAGING — CR DG CHEST 2V
1 series · 2 of 2 positions shown · non-contrast
Comparison: 08/19/2014 and 02/21/2014

CLINICAL DATA: Chest pain.

EXAM:
CHEST  2 VIEW

[Series 1: dg chest 2 view · 0.14mm/px · 2 of 2 slices shown]
[im 1/2]
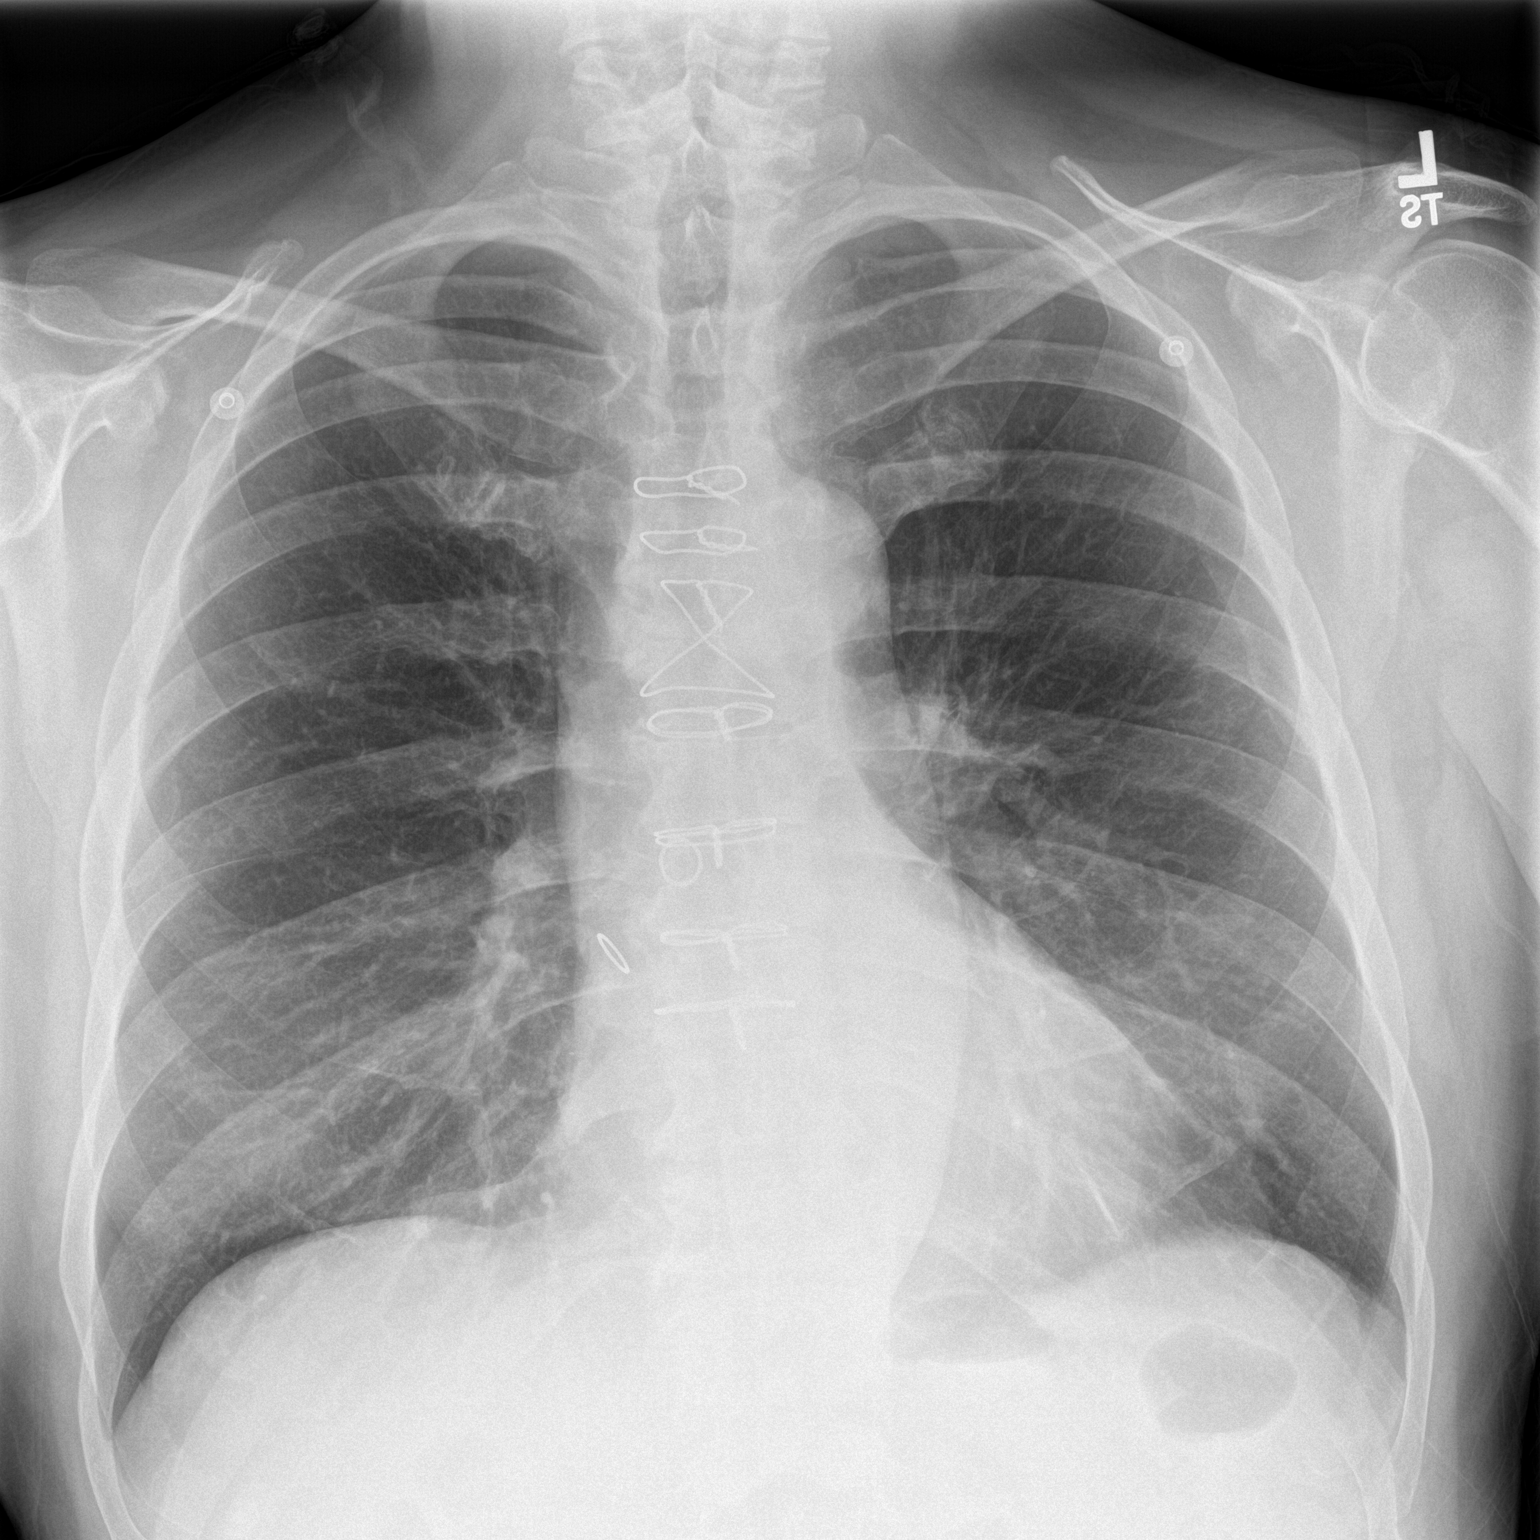
[im 2/2]
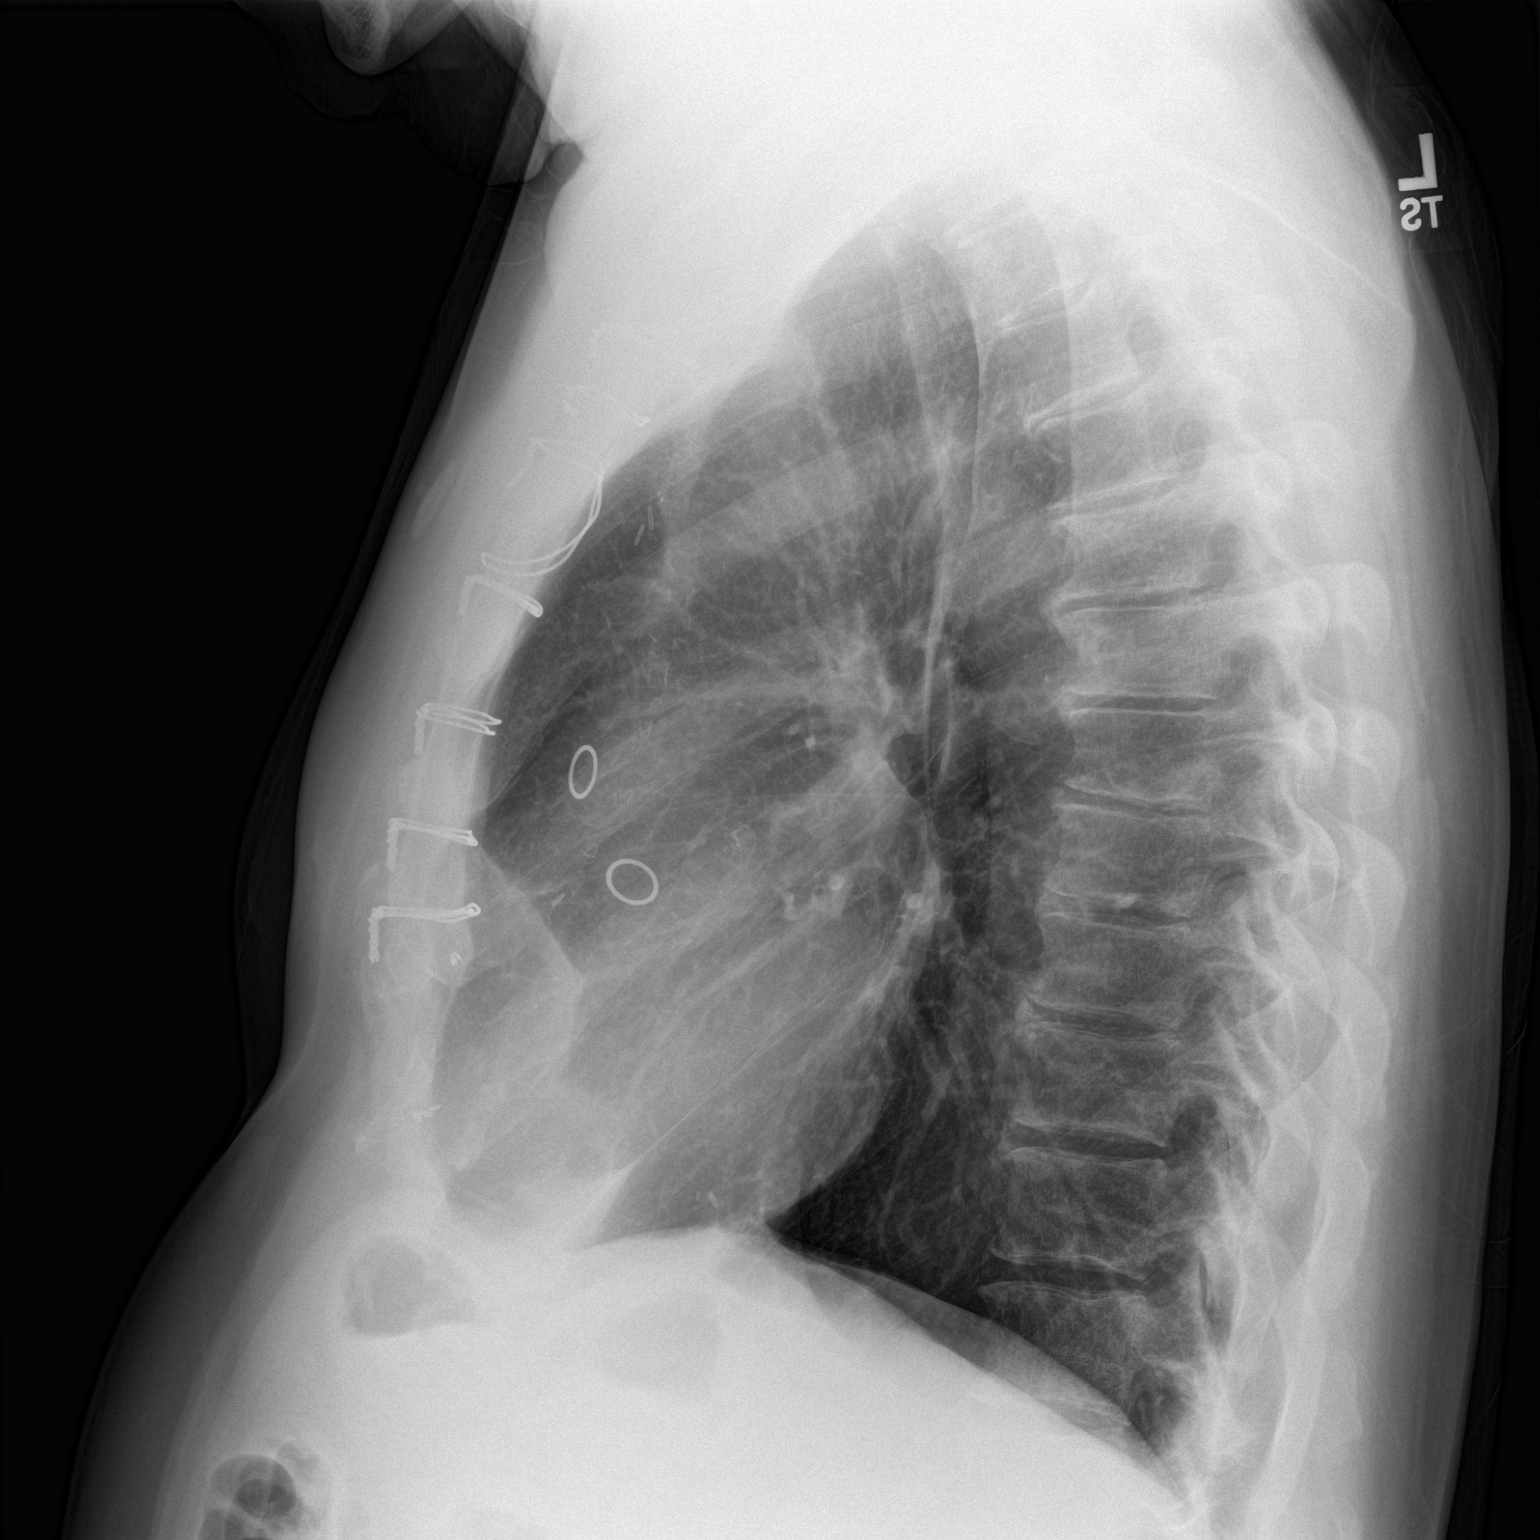

[2 of 2 positions shown; findings below may reference images not displayed]

FINDINGS: Heart size and pulmonary vascularity are normal and the lungs are
clear. Prior CABG. No acute osseous abnormality. No effusions.
IMPRESSION: No active cardiopulmonary disease.

## 2016-08-03 IMAGING — CT CT HEAD W/O CM
2 series · 14 of 30 positions shown, 16 images · non-contrast
Comparison: None.

CLINICAL DATA: Fell today, striking left forehead.

EXAM:
CT HEAD WITHOUT CONTRAST
TECHNIQUE: Contiguous axial images were obtained from the base of the skull
through the vertex without intravenous contrast.

[Series 2: head wo · axial · 0.45mm/px · z∈[+319,+444]mm · 6 of 35 slices shown, 8 images]
[im 5/35  brain]
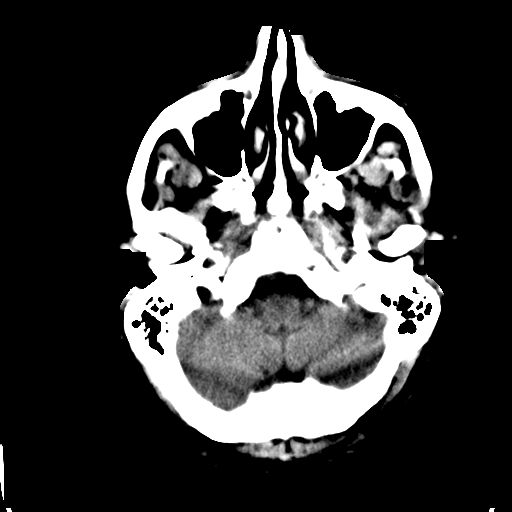
[im 5/35  bone]
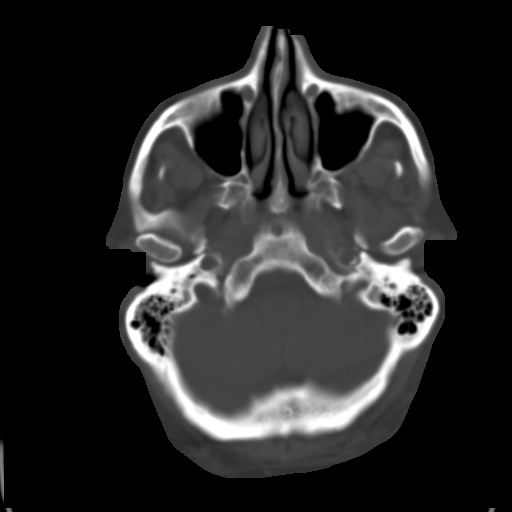
[im 10/35  brain]
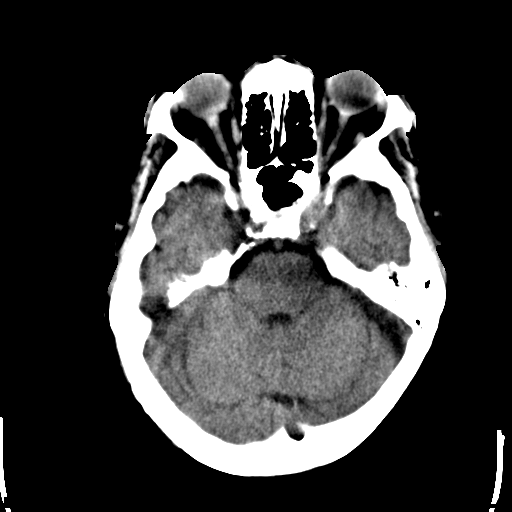
[im 15/35  brain]
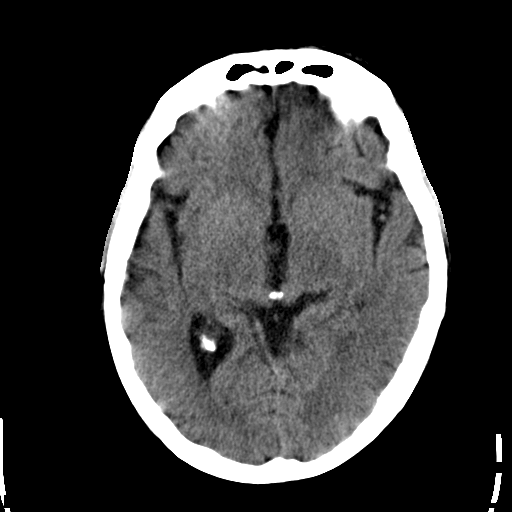
[im 20/35  brain]
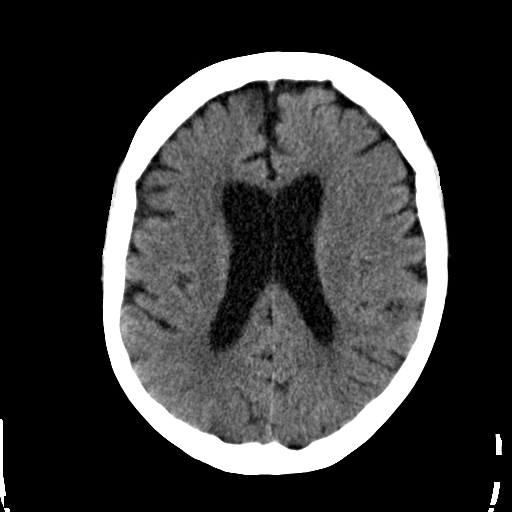
[im 25/35  brain]
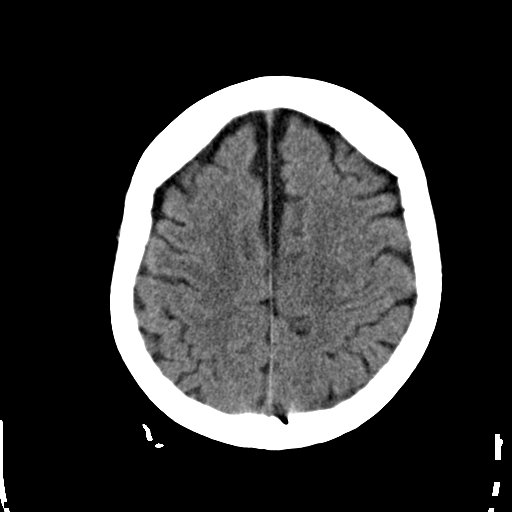
[im 25/35  bone]
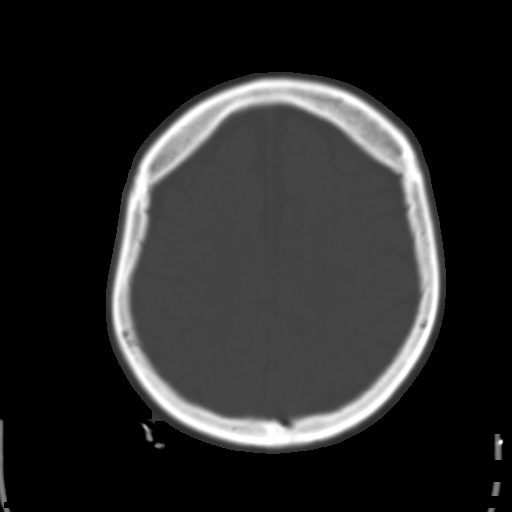
[im 30/35  brain]
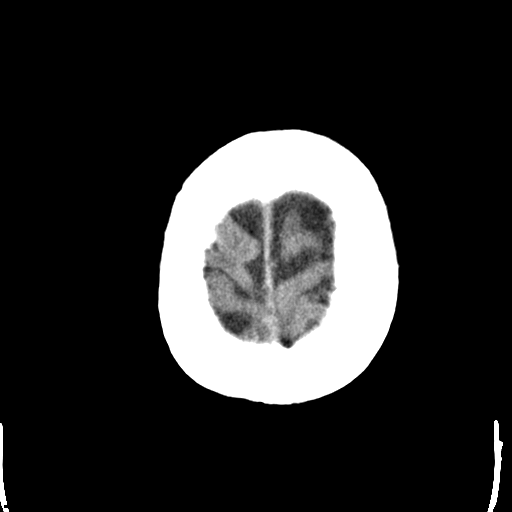

[Series 3: head bone · axial · 0.45mm/px · z∈[+307,+461]mm · 8 of 97 slices shown]
[im 10/97  bone]
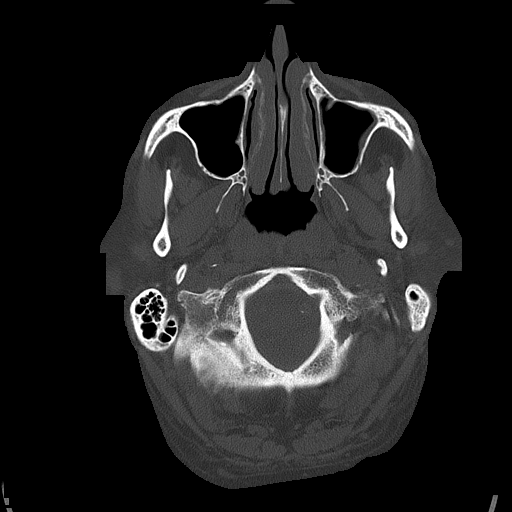
[im 19/97  bone]
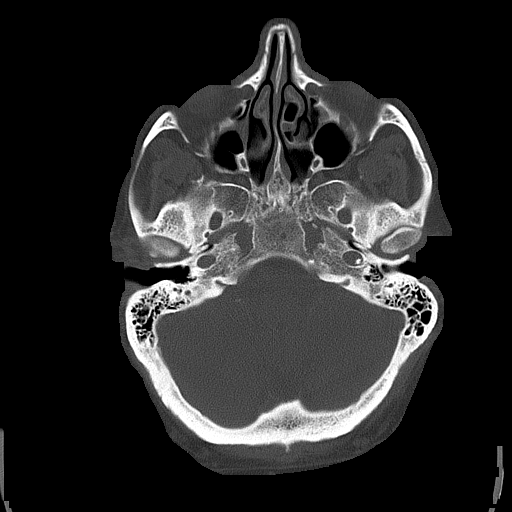
[im 33/97  bone]
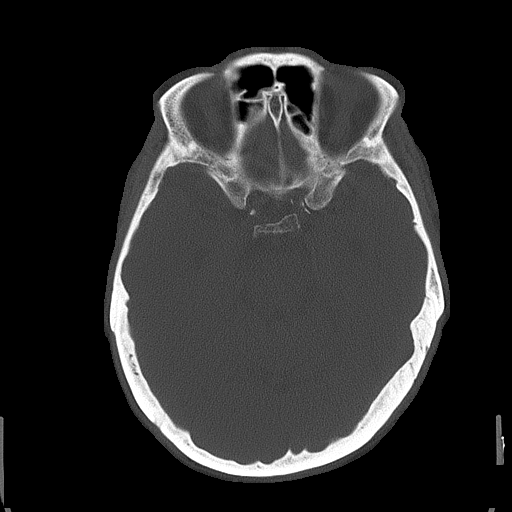
[im 42/97  bone]
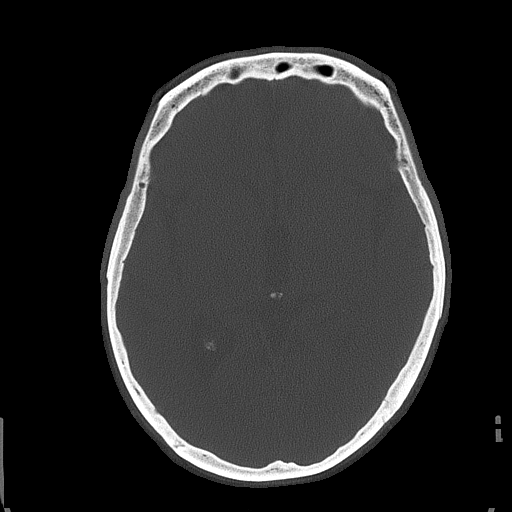
[im 55/97  bone]
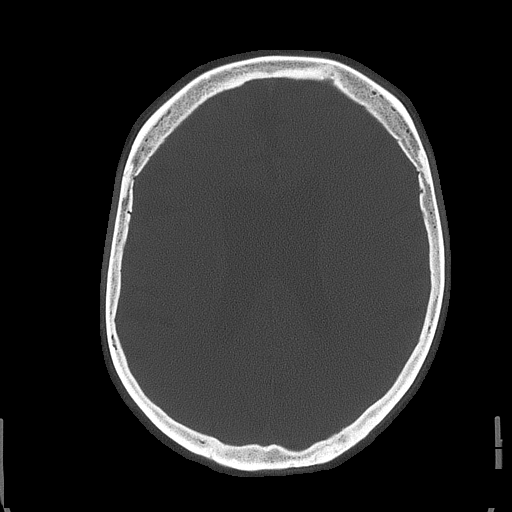
[im 65/97  bone]
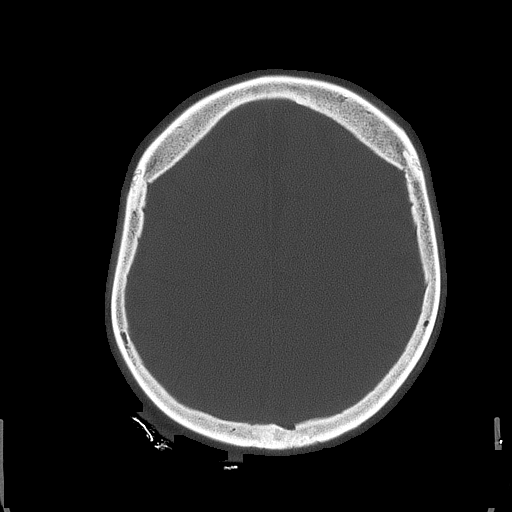
[im 78/97  bone]
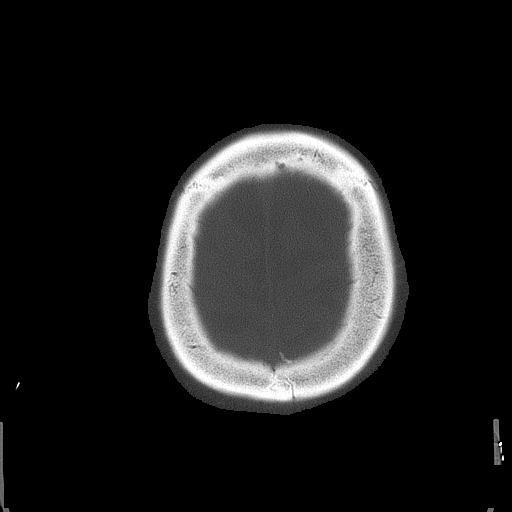
[im 87/97  bone]
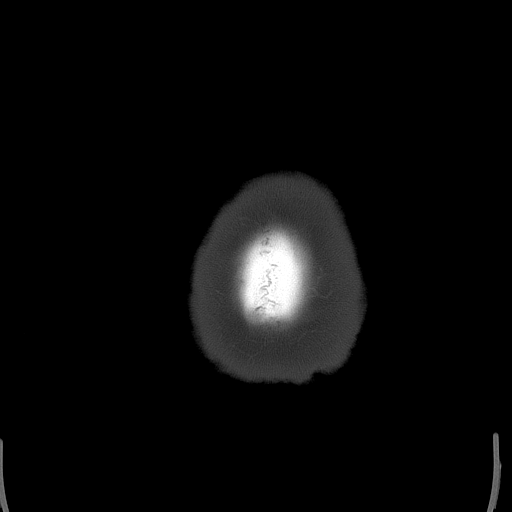

[14 of 30 positions shown; findings below may reference images not displayed]

FINDINGS: There is mild left periorbital and supraorbital soft tissue
swelling.

There is no intracranial hemorrhage or extra-axial fluid collection.
There is moderate generalized atrophy. There is white matter
hypodensity consistent with chronic microvascular disease. A few
chronic lacunar infarctions are suggested in the left thalamus and
right lentiform nucleus. No acute intracranial findings are evident.

Skull base and calvarium are intact.
IMPRESSION: Negative for acute intracranial traumatic injury. There is moderate
generalized atrophy, mild chronic microvascular changes and a few
small chronic lacunar infarctions.
